# Patient Record
Sex: Female | Born: 1970 | Race: White | Hispanic: No | Smoking: Never smoker
Health system: Southern US, Community
[De-identification: ages and names within clinical notes are randomized; demographics above are authoritative.]

## PROBLEM LIST (undated history)

## (undated) DIAGNOSIS — K5792 Diverticulitis of intestine, part unspecified, without perforation or abscess without bleeding: Secondary | ICD-10-CM

## (undated) DIAGNOSIS — G43909 Migraine, unspecified, not intractable, without status migrainosus: Secondary | ICD-10-CM

## (undated) DIAGNOSIS — R7303 Prediabetes: Secondary | ICD-10-CM

## (undated) DIAGNOSIS — K219 Gastro-esophageal reflux disease without esophagitis: Secondary | ICD-10-CM

## (undated) DIAGNOSIS — T783XXA Angioneurotic edema, initial encounter: Secondary | ICD-10-CM

## (undated) DIAGNOSIS — K589 Irritable bowel syndrome without diarrhea: Secondary | ICD-10-CM

## (undated) DIAGNOSIS — E079 Disorder of thyroid, unspecified: Secondary | ICD-10-CM

## (undated) DIAGNOSIS — L509 Urticaria, unspecified: Secondary | ICD-10-CM

## (undated) DIAGNOSIS — R112 Nausea with vomiting, unspecified: Secondary | ICD-10-CM

## (undated) DIAGNOSIS — D332 Benign neoplasm of brain, unspecified: Secondary | ICD-10-CM

## (undated) DIAGNOSIS — L309 Dermatitis, unspecified: Secondary | ICD-10-CM

## (undated) DIAGNOSIS — F329 Major depressive disorder, single episode, unspecified: Secondary | ICD-10-CM

## (undated) DIAGNOSIS — Z9889 Other specified postprocedural states: Secondary | ICD-10-CM

## (undated) DIAGNOSIS — M25511 Pain in right shoulder: Secondary | ICD-10-CM

## (undated) DIAGNOSIS — F419 Anxiety disorder, unspecified: Secondary | ICD-10-CM

## (undated) DIAGNOSIS — K5790 Diverticulosis of intestine, part unspecified, without perforation or abscess without bleeding: Secondary | ICD-10-CM

## (undated) DIAGNOSIS — R42 Dizziness and giddiness: Secondary | ICD-10-CM

## (undated) DIAGNOSIS — I10 Essential (primary) hypertension: Secondary | ICD-10-CM

## (undated) DIAGNOSIS — G8929 Other chronic pain: Secondary | ICD-10-CM

## (undated) DIAGNOSIS — F32A Depression, unspecified: Secondary | ICD-10-CM

## (undated) HISTORY — PX: ENDOMETRIAL ABLATION: SHX621

## (undated) HISTORY — PX: NASAL SINUS SURGERY: SHX719

## (undated) HISTORY — PX: COLONOSCOPY WITH ESOPHAGOGASTRODUODENOSCOPY (EGD): SHX5779

## (undated) HISTORY — DX: Urticaria, unspecified: L50.9

## (undated) HISTORY — PX: BRAIN SURGERY: SHX531

## (undated) HISTORY — PX: COSMETIC SURGERY: SHX468

## (undated) HISTORY — PX: ABLATION: SHX5711

## (undated) HISTORY — DX: Angioneurotic edema, initial encounter: T78.3XXA

## (undated) HISTORY — PX: TYMPANOSTOMY TUBE PLACEMENT: SHX32

## (undated) HISTORY — DX: Dermatitis, unspecified: L30.9

## (undated) HISTORY — PX: ABDOMINAL HYSTERECTOMY: SHX81

## (undated) HISTORY — PX: OVARIAN CYST REMOVAL: SHX89

## (undated) HISTORY — PX: BELPHAROPTOSIS REPAIR: SHX369

---
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
## 1988-02-23 DIAGNOSIS — R569 Unspecified convulsions: Secondary | ICD-10-CM

## 1988-02-23 HISTORY — DX: Unspecified convulsions: R56.9

## 2014-12-23 ENCOUNTER — Emergency Department (HOSPITAL_BASED_OUTPATIENT_CLINIC_OR_DEPARTMENT_OTHER): Payer: Self-pay

## 2014-12-23 ENCOUNTER — Encounter (HOSPITAL_BASED_OUTPATIENT_CLINIC_OR_DEPARTMENT_OTHER): Payer: Self-pay | Admitting: Emergency Medicine

## 2014-12-23 ENCOUNTER — Emergency Department (HOSPITAL_BASED_OUTPATIENT_CLINIC_OR_DEPARTMENT_OTHER)
Admission: EM | Admit: 2014-12-23 | Discharge: 2014-12-23 | Disposition: A | Discharge status: Home / Self Care | Payer: Self-pay | Attending: Emergency Medicine | Admitting: Emergency Medicine

## 2014-12-23 DIAGNOSIS — R51 Headache: Secondary | ICD-10-CM | POA: Insufficient documentation

## 2014-12-23 DIAGNOSIS — Z8679 Personal history of other diseases of the circulatory system: Secondary | ICD-10-CM | POA: Insufficient documentation

## 2014-12-23 DIAGNOSIS — Z85841 Personal history of malignant neoplasm of brain: Secondary | ICD-10-CM | POA: Insufficient documentation

## 2014-12-23 DIAGNOSIS — R42 Dizziness and giddiness: Secondary | ICD-10-CM | POA: Insufficient documentation

## 2014-12-23 HISTORY — DX: Dizziness and giddiness: R42

## 2014-12-23 HISTORY — DX: Benign neoplasm of brain, unspecified: D33.2

## 2014-12-23 HISTORY — DX: Migraine, unspecified, not intractable, without status migrainosus: G43.909

## 2014-12-23 IMAGING — CT CT HEAD W/O CM
1 series · 16 of 30 positions shown, 20 images · non-contrast
Comparison: None.

CLINICAL DATA: Left-sided headache and dizziness. History of
migraine headaches and surgery for benign brain tumor.

EXAM:
CT HEAD WITHOUT CONTRAST
TECHNIQUE: Contiguous axial images were obtained from the base of the skull
through the vertex without intravenous contrast.

[Series 2: head 4.8 h37s · axial · 0.44mm/px · z∈[-142,-6]mm · 16 of 32 slices shown, 20 images]
[im 2/32  brain]
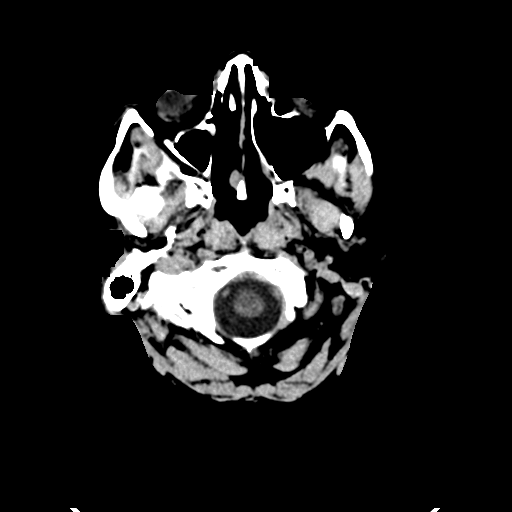
[im 2/32  bone]
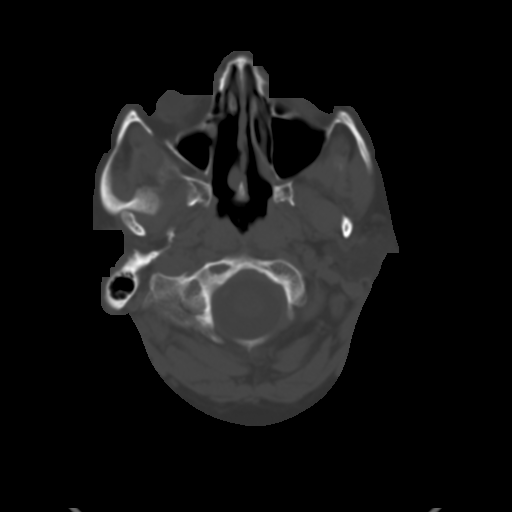
[im 4/32  brain]
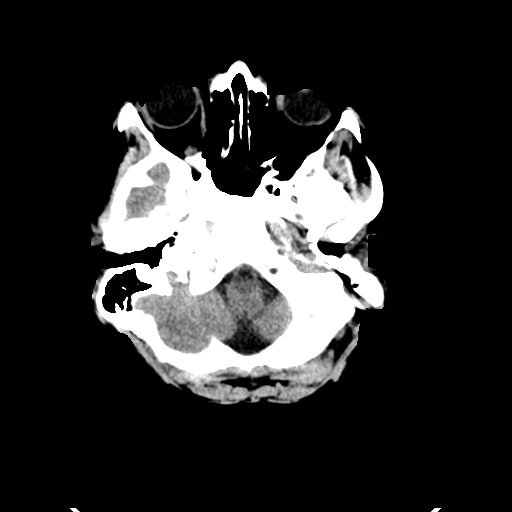
[im 6/32  brain]
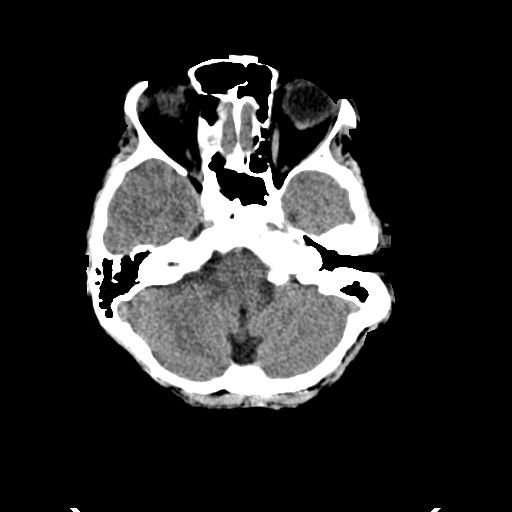
[im 8/32  brain]
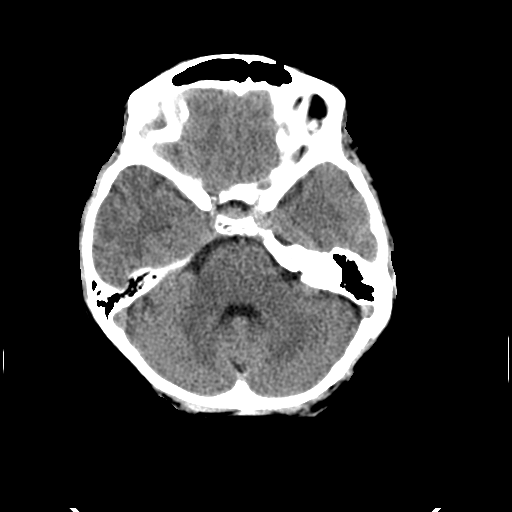
[im 9/32  brain]
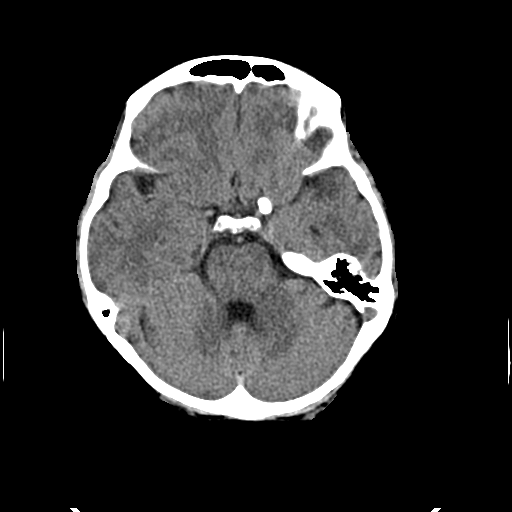
[im 9/32  bone]
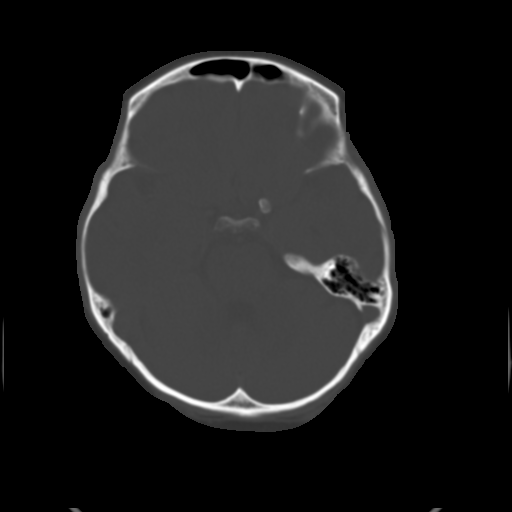
[im 11/32  brain]
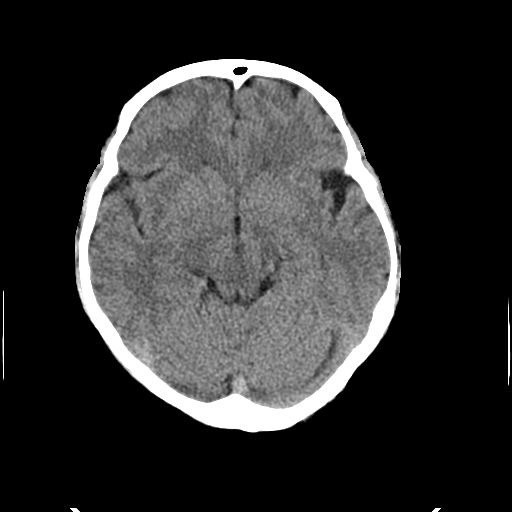
[im 13/32  brain]
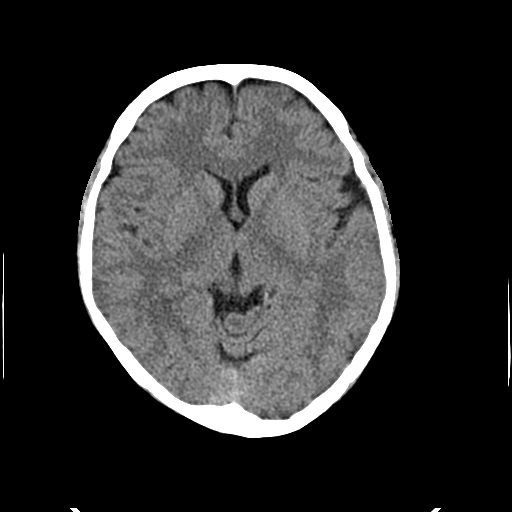
[im 15/32  brain]
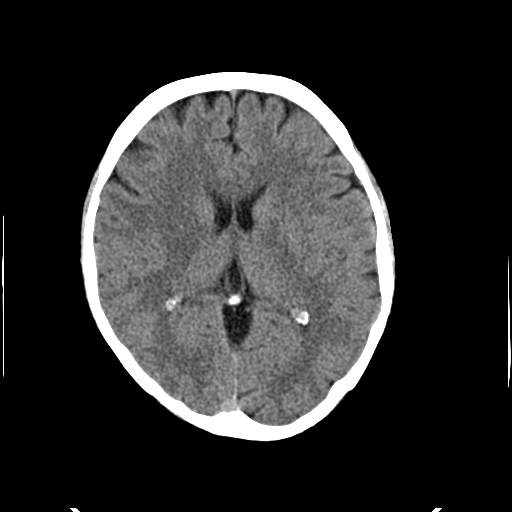
[im 17/32  brain]
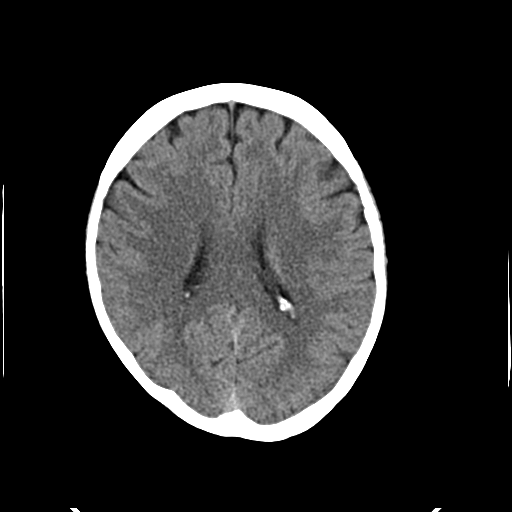
[im 17/32  bone]
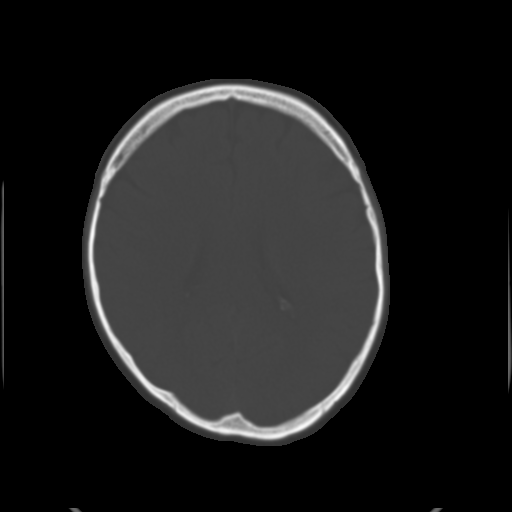
[im 19/32  brain]
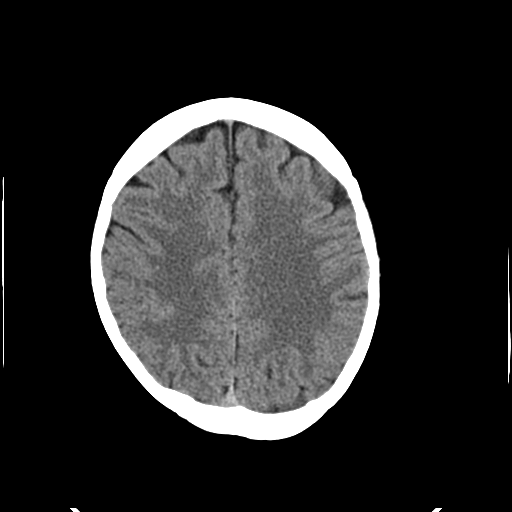
[im 21/32  brain]
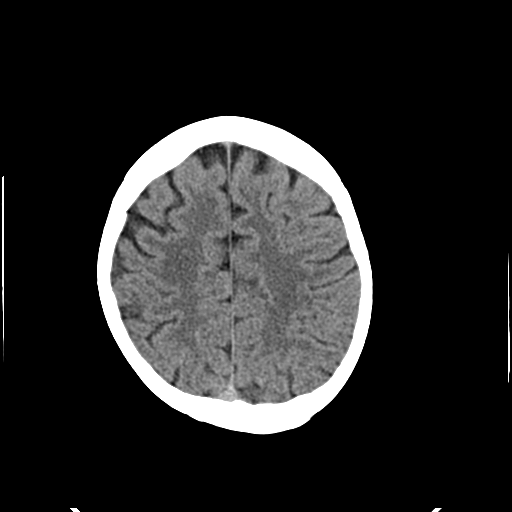
[im 23/32  brain]
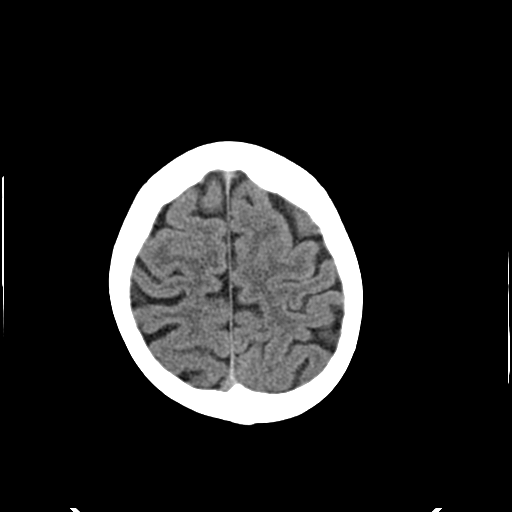
[im 24/32  brain]
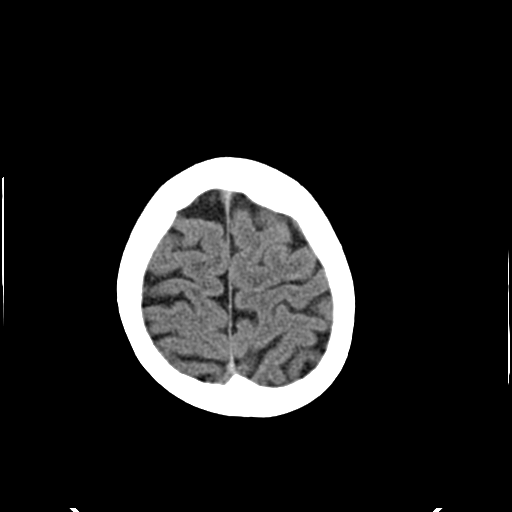
[im 24/32  bone]
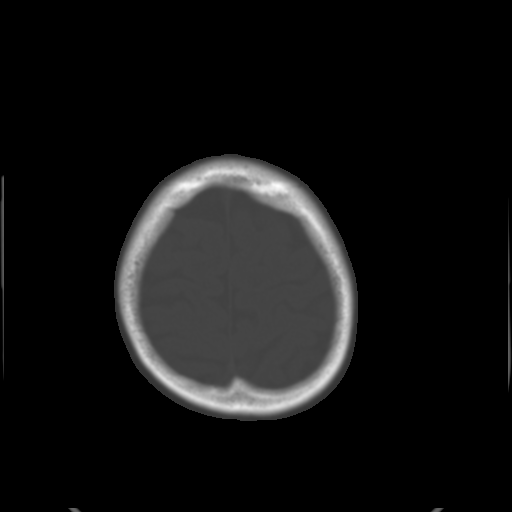
[im 26/32  brain]
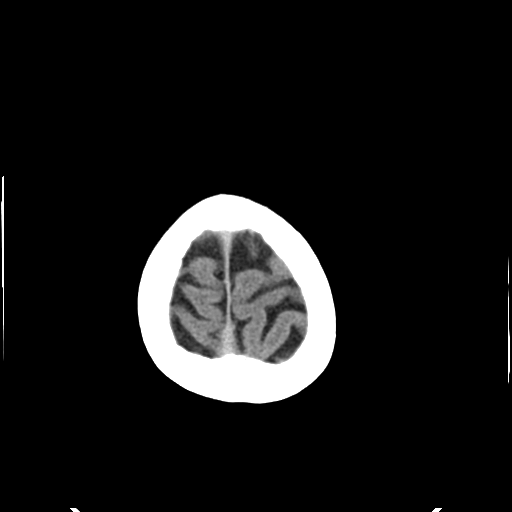
[im 28/32  brain]
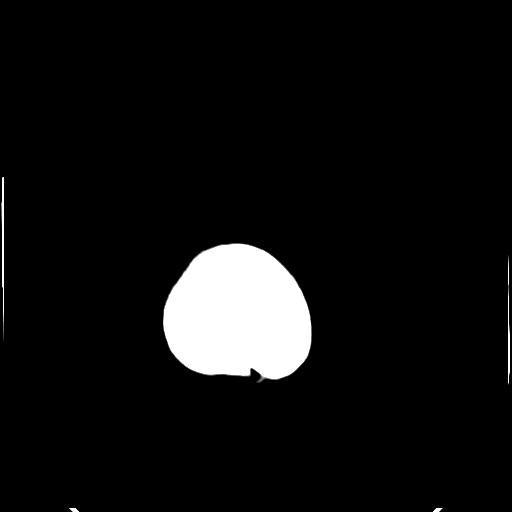
[im 30/32  brain]
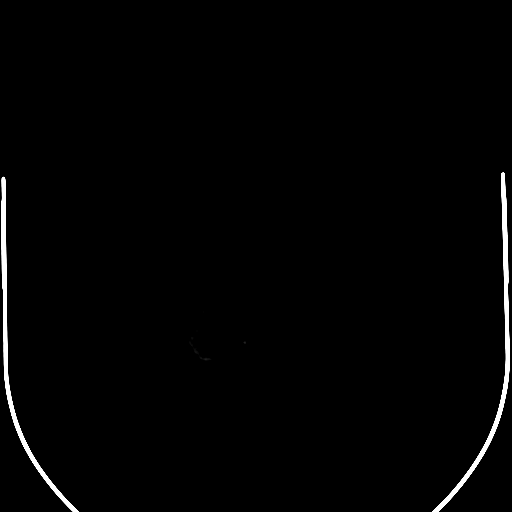

[16 of 30 positions shown; findings below may reference images not displayed]

FINDINGS: The brain demonstrates no evidence of hemorrhage, infarction, edema,
mass effect, extra-axial fluid collection, hydrocephalus or mass
lesion. The skull is unremarkable and shows no evidence of prior
craniotomy.
IMPRESSION: Normal head CT.

## 2014-12-23 MED ORDER — MECLIZINE HCL 25 MG PO TABS
25.0000 mg | ORAL_TABLET | Freq: Once | ORAL | Status: AC
  Administered 2014-12-23: 25 mg via ORAL
  Filled 2014-12-23: qty 1

## 2014-12-23 MED ORDER — MAGNESIUM SULFATE 2 GM/50ML IV SOLN
2.0000 g | Freq: Once | INTRAVENOUS | Status: AC
  Administered 2014-12-23: 2 g via INTRAVENOUS
  Filled 2014-12-23: qty 50

## 2014-12-23 MED ORDER — DIPHENHYDRAMINE HCL 50 MG/ML IJ SOLN
50.0000 mg | Freq: Once | INTRAMUSCULAR | Status: AC
  Administered 2014-12-23: 50 mg via INTRAVENOUS
  Filled 2014-12-23: qty 1

## 2014-12-23 MED ORDER — METOCLOPRAMIDE HCL 5 MG/ML IJ SOLN
10.0000 mg | Freq: Once | INTRAMUSCULAR | Status: AC
  Administered 2014-12-23: 10 mg via INTRAVENOUS
  Filled 2014-12-23: qty 2

## 2014-12-23 MED ORDER — KETOROLAC TROMETHAMINE 30 MG/ML IJ SOLN
30.0000 mg | Freq: Once | INTRAMUSCULAR | Status: AC
  Administered 2014-12-23: 30 mg via INTRAVENOUS
  Filled 2014-12-23: qty 1

## 2014-12-23 MED ORDER — LORAZEPAM 1 MG PO TABS
1.0000 mg | ORAL_TABLET | Freq: Once | ORAL | Status: AC
  Administered 2014-12-23: 1 mg via ORAL
  Filled 2014-12-23: qty 1

## 2014-12-23 NOTE — ED Notes (Signed)
Pt states she feels better, not dizzy anymore

## 2014-12-23 NOTE — ED Notes (Signed)
Migraine since midday to left side of head - per her usual migraine.  Took Ibuprofen.  Dizziness and nausea since 3pm.

## 2014-12-23 NOTE — ED Provider Notes (Signed)
CSN: [id number]     Arrival date & time 12/23/14  1749 History   First MD Initiated Contact with Patient 12/23/14 1759     Chief Complaint  Patient presents with  . Dizziness     (Consider location/radiation/quality/duration/timing/severity/associated sxs/prior Treatment) HPI Natassja Ollis is a 44 y.o. female who comes in for evaluation of headache and dizziness. Patient reports she has a history of migraines, felt like she was having a typical migraine today at 11:00 AM. She felt it was mild and only took ibuprofen at this time. Reports she typically takes meclizine in addition, but decided not to take it due to the mild symptoms. She reports her migraine was improved, but was still present "just enough to annoy me" when she was driving to work at 4:31 PM. She reports while sitting at a traffic light she began to "see white spots, like when I'm about to have a migraine". She reports falling over on the side of the road at that point. She reports sitting for 20 minutes and her symptoms began to improve. She tried getting out of the car and stepped up on the sidewalk at which point "I felt like I was veering to the left and could not walk straight". She reports associated nausea but no vomiting. She also reports feeling hot, bilateral hand tightness as well as her lips feeling dry. Denies any other numbness or weaknesses. No fevers or chills.  Past Medical History  Diagnosis Date  . Migraines   . Vertigo   . Brain tumor (benign) [facility])    Past Surgical History  Procedure Laterality Date  . Brain surgery    . Cosmetic surgery      Facial surgery due to dog bite  . Nasal sinus surgery    . Endometrial ablation     No family history on file. Social History  Substance Use Topics  . Smoking status: Never Smoker   . Smokeless tobacco: None  . Alcohol Use: No   OB History    No data available     Review of Systems A 10 point review of systems was completed and was negative except for  pertinent positives and negatives as mentioned in the history of present illness     Allergies  Review of patient's allergies indicates no known allergies.  Home Medications   Prior to Admission medications   Not on File   BP 101/58 mmHg  Pulse 63  Temp(Src) 97.7 F (36.5 C) (Oral)  Resp 14  Ht 5\' 5"  (1.651 m)  Wt 160 lb (72.576 kg)  BMI 26.63 kg/m2  SpO2 97% Physical Exam  Constitutional: She is oriented to person, place, and time. She appears well-developed and well-nourished.  HENT:  Head: Normocephalic and atraumatic.  Mouth/Throat: Oropharynx is clear and moist.  Eyes: Conjunctivae and EOM are normal. Pupils are equal, round, and reactive to light. Right eye exhibits no discharge. Left eye exhibits no discharge. No scleral icterus.  No nystagmus  Neck: Normal range of motion. Neck supple.  No meningismus or nuchal rigidity  Cardiovascular: Normal rate, regular rhythm and normal heart sounds.   Pulmonary/Chest: Effort normal and breath sounds normal. No respiratory distress. She has no wheezes. She has no rales.  Abdominal: Soft. There is no tenderness.  Musculoskeletal: Normal range of motion. She exhibits no tenderness.  Neurological: She is alert and oriented to person, place, and time.  Cranial nerves III through XII grossly intact. Motor strength is 5/5 in all 4 extremities. Sensation  intact to light touch. No pronator drift. Completes finger to nose and heel to shin coordination movements without difficulty. Gait appears somewhat unsteady as she walks to the bathroom, but no obvious ataxia.  Skin: Skin is warm and dry. No rash noted.  Psychiatric: She has a normal mood and affect.  Nursing note and vitals reviewed.   ED Course  Procedures (including critical care time) Labs Review Labs Reviewed - No data to display  Imaging Review Ct Head Wo Contrast  12/23/2014  CLINICAL DATA:  Left-sided headache and dizziness. History of migraine headaches and surgery for  benign brain tumor. EXAM: CT HEAD WITHOUT CONTRAST TECHNIQUE: Contiguous axial images were obtained from the base of the skull through the vertex without intravenous contrast. COMPARISON:  None. FINDINGS: The brain demonstrates no evidence of hemorrhage, infarction, edema, mass effect, extra-axial fluid collection, hydrocephalus or mass lesion. The skull is unremarkable and shows no evidence of prior craniotomy. IMPRESSION: Normal head CT. Electronically Signed   By: Aletta Edouard M.D.   On: 12/23/2014 18:52   I have personally reviewed and evaluated these images and lab results as part of my medical decision-making.   EKG Interpretation None     Meds given in ED:  Medications  meclizine (ANTIVERT) tablet 25 mg (25 mg Oral Given 12/23/14 1827)  LORazepam (ATIVAN) tablet 1 mg (1 mg Oral Given 12/23/14 1937)  ketorolac (TORADOL) 30 MG/ML injection 30 mg (30 mg Intravenous Given 12/23/14 2030)  metoCLOPramide (REGLAN) injection 10 mg (10 mg Intravenous Given 12/23/14 2033)  diphenhydrAMINE (BENADRYL) injection 50 mg (50 mg Intravenous Given 12/23/14 2031)  magnesium sulfate IVPB 2 g 50 mL (0 g Intravenous Stopped 12/23/14 2153)    There are no discharge medications for this patient.  Filed Vitals:   12/23/14 1756 12/23/14 1927 12/23/14 2153  BP: 127/74 104/63 101/58  Pulse: 69 63 63  Temp: 97.7 F (36.5 C)    TempSrc: Oral    Resp: 16 16 14   Height: 5\' 5"  (1.651 m)    Weight: 160 lb (72.576 kg)    SpO2: 98% 100% 97%    MDM  Rahaf Carbonell is a 44 y.o. female with history of documented migraines and vertigo comes in for evaluation of dizziness. Patient only has dizziness when she moves her head from left-to-right. Denies any symptoms at rest. Patient with no focal neurodeficits on exam. Gait is slightly unsteady but no ataxia. Due to previous history and unclear etiology of dizziness, obtain CT head. CT head is negative. Patient is resting comfortably in the ED after migraine  cocktail. Reports the dizziness has resolved after treatment. Discussed exam, results of CT imaging with family at bedside. They report the patient has "dizzy spells" frequently regardless of whether or not she has headache. Patient did not take her meclizine today which may explain her dizzy spells. At this time, family at bedside is comfortable with and requesting patient being discharged home and they do not want to pursue any further imaging or investigative processes. I feel this is reasonable given patient has had a nonfocal neuro exam, normal CT of her head and family reports the symptoms have happened in the past. Her symptoms seem to be vertiginous in nature and I have a low suspicion for central vertigo or other central lesion pathology. No evidence of other acute or emergent pathology. Overall, patient appears well and is appropriate for discharge. Prior to discharge, I discussed and reviewed this case with my attending, Dr. Ralene Bathe. Final diagnoses:  Dizziness       Comer Locket, PA-C 12/24/14 [place], MD 01/02/15 [phone]

## 2014-12-23 NOTE — ED Notes (Signed)
Pt states she is still dizzy after medication, provider informed.

## 2014-12-23 NOTE — Discharge Instructions (Signed)
There does not appear to be an emergent cause for your symptoms at this time. Please follow-up with your doctor for reevaluation in one to 2 days. Your exam was reassuring in your CT scan of her head did not show any acute problems. Return to ED for worsening symptoms.  Dizziness Dizziness is a common problem. It is a feeling of unsteadiness or light-headedness. You may feel like you are about to faint. Dizziness can lead to injury if you stumble or fall. Anyone can become dizzy, but dizziness is more common in older adults. This condition can be caused by a number of things, including medicines, dehydration, or illness. HOME CARE INSTRUCTIONS Taking these steps may help with your condition: Eating and Drinking  Drink enough fluid to keep your urine clear or pale yellow. This helps to keep you from becoming dehydrated. Try to drink more clear fluids, such as water.  Do not drink alcohol.  Limit your caffeine intake if directed by your health care provider.  Limit your salt intake if directed by your health care provider. Activity  Avoid making quick movements.  Rise slowly from chairs and steady yourself until you feel okay.  In the morning, first sit up on the side of the bed. When you feel okay, stand slowly while you hold onto something until you know that your balance is fine.  Move your legs often if you need to stand in one place for a long time. Tighten and relax your muscles in your legs while you are standing.  Do not drive or operate heavy machinery if you feel dizzy.  Avoid bending down if you feel dizzy. Place items in your home so that they are easy for you to reach without leaning over. Lifestyle  Do not use any tobacco products, including cigarettes, chewing tobacco, or electronic cigarettes. If you need help quitting, ask your health care provider.  Try to reduce your stress level, such as with yoga or meditation. Talk with your health care provider if you need  help. General Instructions  Watch your dizziness for any changes.  Take medicines only as directed by your health care provider. Talk with your health care provider if you think that your dizziness is caused by a medicine that you are taking.  Tell a friend or a family member that you are feeling dizzy. If he or she notices any changes in your behavior, have this person call your health care provider.  Keep all follow-up visits as directed by your health care provider. This is important. SEEK MEDICAL CARE IF:  Your dizziness does not go away.  Your dizziness or light-headedness gets worse.  You feel nauseous.  You have reduced hearing.  You have new symptoms.  You are unsteady on your feet or you feel like the room is spinning. SEEK IMMEDIATE MEDICAL CARE IF:  You vomit or have diarrhea and are unable to eat or drink anything.  You have problems talking, walking, swallowing, or using your arms, hands, or legs.  You feel generally weak.  You are not thinking clearly or you have trouble forming sentences. It may take a friend or family member to notice this.  You have chest pain, abdominal pain, shortness of breath, or sweating.  Your vision changes.  You notice any bleeding.  You have a headache.  You have neck pain or a stiff neck.  You have a fever.   This information is not intended to replace advice given to you by your health care  provider. Make sure you discuss any questions you have with your health care provider.   Document Released: 08/04/2000 Document Revised: 06/25/2014 Document Reviewed: 02/04/2014 Elsevier Interactive Patient Education 2016 [employer]  Benign Positional Vertigo Vertigo is the feeling that you or your surroundings are moving when they are not. Benign positional vertigo is the most common form of vertigo. The cause of this condition is not serious (is benign). This condition is triggered by certain movements and positions (is  positional). This condition can be dangerous if it occurs while you are doing something that could endanger you or others, such as driving.  CAUSES In many cases, the cause of this condition is not known. It may be caused by a disturbance in an area of the inner ear that helps your brain to sense movement and balance. This disturbance can be caused by a viral infection (labyrinthitis), head injury, or repetitive motion. RISK FACTORS This condition is more likely to develop in:  Women.  People who are 30 years of age or older. SYMPTOMS Symptoms of this condition usually happen when you move your head or your eyes in different directions. Symptoms may start suddenly, and they usually last for less than a minute. Symptoms may include:  Loss of balance and falling.  Feeling like you are spinning or moving.  Feeling like your surroundings are spinning or moving.  Nausea and vomiting.  Blurred vision.  Dizziness.  Involuntary eye movement (nystagmus). Symptoms can be mild and cause only slight annoyance, or they can be severe and interfere with daily life. Episodes of benign positional vertigo may return (recur) over time, and they may be triggered by certain movements. Symptoms may improve over time. DIAGNOSIS This condition is usually diagnosed by medical history and a physical exam of the head, neck, and ears. You may be referred to a health care provider who specializes in ear, nose, and throat (ENT) problems (otolaryngologist) or a provider who specializes in disorders of the nervous system (neurologist). You may have additional testing, including:  MRI.  A CT scan.  Eye movement tests. Your health care provider may ask you to change positions quickly while he or she watches you for symptoms of benign positional vertigo, such as nystagmus. Eye movement may be tested with an electronystagmogram (ENG), caloric stimulation, the Dix-Hallpike test, or the roll test.  An  electroencephalogram (EEG). This records electrical activity in your brain.  Hearing tests. TREATMENT Usually, your health care provider will treat this by moving your head in specific positions to adjust your inner ear back to normal. Surgery may be needed in severe cases, but this is rare. In some cases, benign positional vertigo may resolve on its own in 2-4 weeks. HOME CARE INSTRUCTIONS Safety  Move slowly.Avoid sudden body or head movements.  Avoid driving.  Avoid operating heavy machinery.  Avoid doing any tasks that would be dangerous to you or others if a vertigo episode would occur.  If you have trouble walking or keeping your balance, try using a cane for stability. If you feel dizzy or unstable, sit down right away.  Return to your normal activities as told by your health care provider. Ask your health care provider what activities are safe for you. General Instructions  Take over-the-counter and prescription medicines only as told by your health care provider.  Avoid certain positions or movements as told by your health care provider.  Drink enough fluid to keep your urine clear or pale yellow.  Keep all follow-up visits  as told by your health care provider. This is important. SEEK MEDICAL CARE IF:  You have a fever.  Your condition gets worse or you develop new symptoms.  Your family or friends notice any behavioral changes.  Your nausea or vomiting gets worse.  You have numbness or a "pins and needles" sensation. SEEK IMMEDIATE MEDICAL CARE IF:  You have difficulty speaking or moving.  You are always dizzy.  You faint.  You develop severe headaches.  You have weakness in your legs or arms.  You have changes in your hearing or vision.  You develop a stiff neck.  You develop sensitivity to light.   This information is not intended to replace advice given to you by your health care provider. Make sure you discuss any questions you have with your  health care provider.   Document Released: 11/16/2005 Document Revised: 10/30/2014 Document Reviewed: 06/03/2014 Elsevier Interactive Patient Education [employer].

## 2016-03-28 ENCOUNTER — Emergency Department (HOSPITAL_BASED_OUTPATIENT_CLINIC_OR_DEPARTMENT_OTHER)
Admission: EM | Admit: 2016-03-28 | Discharge: 2016-03-28 | Disposition: A | Discharge status: Home / Self Care | Payer: Self-pay | Attending: Emergency Medicine | Admitting: Emergency Medicine

## 2016-03-28 ENCOUNTER — Encounter (HOSPITAL_BASED_OUTPATIENT_CLINIC_OR_DEPARTMENT_OTHER): Payer: Self-pay | Admitting: Emergency Medicine

## 2016-03-28 DIAGNOSIS — R1110 Vomiting, unspecified: Secondary | ICD-10-CM | POA: Insufficient documentation

## 2016-03-28 DIAGNOSIS — R42 Dizziness and giddiness: Secondary | ICD-10-CM | POA: Insufficient documentation

## 2016-03-28 HISTORY — DX: Disorder of thyroid, unspecified: E07.9

## 2016-03-28 LAB — BASIC METABOLIC PANEL
ANION GAP: 6 (ref 5–15)
BUN: 13 mg/dL (ref 6–20)
CALCIUM: 9 mg/dL (ref 8.9–10.3)
CHLORIDE: 107 mmol/L (ref 101–111)
CO2: 25 mmol/L (ref 22–32)
CREATININE: 0.62 mg/dL (ref 0.44–1.00)
GFR calc Af Amer: >60 mL/min (ref >60)
GFR calc non Af Amer: >60 mL/min (ref >60)
Glucose, Bld: 109 mg/dL — ABNORMAL HIGH (ref 65–99)
Potassium: 3.9 mmol/L (ref 3.5–5.1)
SODIUM: 138 mmol/L (ref 135–145)

## 2016-03-28 LAB — CBC
HCT: 40.5 % (ref 36.0–46.0)
Hemoglobin: 13.2 g/dL (ref 12.0–15.0)
MCH: 32.4 pg (ref 26.0–34.0)
MCHC: 32.6 g/dL (ref 30.0–36.0)
MCV: 99.5 fL (ref 78.0–100.0)
PLATELETS: 278 10*3/uL (ref 150–400)
RBC: 4.07 MIL/uL (ref 3.87–5.11)
RDW: 12.3 % (ref 11.5–15.5)
WBC: 11.2 10*3/uL — AB (ref 4.0–10.5)

## 2016-03-28 LAB — URINALYSIS, ROUTINE W REFLEX MICROSCOPIC
Bilirubin Urine: NEGATIVE
Glucose, UA: NEGATIVE mg/dL
Hgb urine dipstick: NEGATIVE
Ketones, ur: NEGATIVE mg/dL
LEUKOCYTES UA: NEGATIVE
Nitrite: NEGATIVE
PROTEIN: NEGATIVE mg/dL
Specific Gravity, Urine: 1.019 (ref 1.005–1.030)
pH: 7.5 (ref 5.0–8.0)

## 2016-03-28 MED ORDER — SULFAMETHOXAZOLE-TRIMETHOPRIM 800-160 MG PO TABS
1.0000 | ORAL_TABLET | Freq: Once | ORAL | Status: AC
  Administered 2016-03-28: 1 via ORAL
  Filled 2016-03-28: qty 1

## 2016-03-28 MED ORDER — SULFAMETHOXAZOLE-TRIMETHOPRIM 800-160 MG PO TABS: 1.0000 | ORAL_TABLET | Freq: Two times a day (BID) | ORAL | 0 refills | Status: AC

## 2016-03-28 NOTE — ED Notes (Signed)
Reports near syncope episode after vomiting.  Reports hx of vertigo.  Patient reports feeling some what better at this time.  Reports post nasal drip.  Reports stress headaches.

## 2016-03-28 NOTE — Discharge Instructions (Signed)
Call your ENT physician tomorrow to arrange to be seen in the office within the next week. Return for fever, persistent vomiting. Or if you feel worse for any reason

## 2016-03-28 NOTE — ED Triage Notes (Signed)
Pt reports dizziness this morning with N/V that lasted around 5 hours. Pt reports hx of vertigo and chronic sinusitis. Pt reports dizziness has subsided. Currently reports facial pain and feeling hot. Pt denies chest pain and SOB

## 2016-03-28 NOTE — ED Provider Notes (Addendum)
[facility] DEPT [facility] Provider Note   CSN: [id number] Arrival date & time: 03/28/16  1541  By signing my name below, I, Judithe Modest, attest that this documentation has been prepared under the direction and in the presence of Orlie Dakin, MD. Electronically Signed: Judithe Modest, ER Scribe. 10/04/2015. 6:02 PM.  History   Chief Complaint Chief Complaint  Patient presents with  . Dizziness   The history is provided by the patient. No language interpreter was used.   HPI Comments: Kristine Zimmerman is a 46 y.o. female who presents to the Emergency Department complaining of being off balance since this morning with associated sinus pressure, two events of vomiting and intermittent left ear popping. States that feeling off balance is typical of the vertigo she's had in the past She has a PMHx of vertigo and migraine HA. She has taken meclozine today for her sx. With relief of vertigo. She had a sinus infection in January and was treated with zpac and avalox. She finished avelox two weeks ago and her sx improved after taking that medication. Her sx are worse with moving or walking and are better when she sits still. Her PCP is Dr. Amedeo Plenty at [facility]. She has a PMHx of hypothyroidism, petuitary tumor, and a surgical hx of sinus surgery performed by Dr. Shanon Brow More.. Vertigo is worse with moving her head or changing positions improved with remaining still and no nausea present. Complains of mild pressure at right side of face  Past Medical History:  Diagnosis Date  . Brain tumor (benign) ([place])   . Migraines   . Thyroid disease   . Vertigo    Pituitary Tumor age 20 There are no active problems to display for this patient.   Past Surgical History:  Procedure Laterality Date  . BRAIN SURGERY    . COSMETIC SURGERY     Facial surgery due to dog bite  . ENDOMETRIAL ABLATION    . NASAL SINUS SURGERY      OB History    No data available       Home Medications    Prior to Admission  medications   Medication Sig Start Date End Date Taking? Authorizing Provider  levothyroxine (SYNTHROID, LEVOTHROID) 100 MCG tablet Take 100 mcg by mouth daily before breakfast.   Yes Historical Provider, MD    Family History No family history on file.  Social History Social History  Substance Use Topics  . Smoking status: Never Smoker  . Smokeless tobacco: Never Used  . Alcohol use No     Allergies   Patient has no known allergies.   Review of Systems Review of Systems  Constitutional: Negative.  Negative for chills.  HENT: Positive for sinus pressure.   Respiratory: Negative.   Cardiovascular: Negative.   Gastrointestinal: Positive for vomiting.  Musculoskeletal: Negative.   Skin: Negative.   Neurological: Positive for dizziness.  Psychiatric/Behavioral: Negative.   All other systems reviewed and are negative.  A complete 10 system review of systems was obtained and all systems are negative except as noted in the HPI and PMH.   Physical Exam Updated Vital Signs BP (!) 109/44   Pulse 71   Temp 98 F (36.7 C) (Oral)   Resp 26   Ht 5\' 5"  (1.651 m)   Wt 160 lb (72.6 kg)   SpO2 100%   BMI 26.63 kg/m   Physical Exam  Constitutional: She is oriented to person, place, and time. She appears well-developed and well-nourished. No distress.  HENT:  Head: Normocephalic and atraumatic.  Right Ear: External ear normal.  Left Ear: External ear normal.  Bilateral tympanic membranes normal  Eyes: Pupils are equal, round, and reactive to light.  Neck: Neck supple.  Cardiovascular: Normal rate.   Pulmonary/Chest: Effort normal. No respiratory distress.  Musculoskeletal: Normal range of motion.  Neurological: She is alert and oriented to person, place, and time. Coordination normal.  Gait normal Romberg normal pronator drift normal finger to nose normal DTR symmetric bilaterally at knee jerk ankle jerk and biceps toes downgoing bilaterally  Skin: Skin is warm and dry. She  is not diaphoretic.  Psychiatric: She has a normal mood and affect. Her behavior is normal.  Nursing note and vitals reviewed.    ED Treatments / Results  DIAGNOSTIC STUDIES: Oxygen Saturation is 100% on RA, normal by my interpretation.    COORDINATION OF CARE: 6:03 PM Discussed treatment plan with pt at bedside and pt agreed to plan.  Labs (all labs ordered are listed, but only abnormal results are displayed) Labs Reviewed  BASIC METABOLIC PANEL - Abnormal; Notable for the following:       Result Value   Glucose, Bld 109 (*)    All other components within normal limits  CBC - Abnormal; Notable for the following:    WBC 11.2 (*)    All other components within normal limits  URINALYSIS, ROUTINE W REFLEX MICROSCOPIC - Abnormal; Notable for the following:    APPearance CLOUDY (*)    All other components within normal limits    EKG  EKG Interpretation  Date/Time:  Sunday March 28 2016 15:53:30 EST Ventricular Rate:  66 PR Interval:  192 QRS Duration: 88 QT Interval:  430 QTC Calculation: 450 R Axis:   11 Text Interpretation:  Normal sinus rhythm with sinus arrhythmia Normal ECG Confirmed by DELO  MD, DOUGLAS ([id number]) on 03/28/2016 4:00:36 PM      Results for orders placed or performed during the hospital encounter of [id number]  Basic metabolic panel  Result Value Ref Range   Sodium 138 135 - 145 mmol/L   Potassium 3.9 3.5 - 5.1 mmol/L   Chloride 107 101 - 111 mmol/L   CO2 25 22 - 32 mmol/L   Glucose, Bld 109 (H) 65 - 99 mg/dL   BUN 13 6 - 20 mg/dL   Creatinine, Ser 0.62 0.44 - 1.00 mg/dL   Calcium 9.0 8.9 - 10.3 mg/dL   GFR calc non Af Amer >60 >60 mL/min   GFR calc Af Amer >60 >60 mL/min   Anion gap 6 5 - 15  CBC  Result Value Ref Range   WBC 11.2 (H) 4.0 - 10.5 K/uL   RBC 4.07 3.87 - 5.11 MIL/uL   Hemoglobin 13.2 12.0 - 15.0 g/dL   HCT 40.5 36.0 - 46.0 %   MCV 99.5 78.0 - 100.0 fL   MCH 32.4 26.0 - 34.0 pg   MCHC 32.6 30.0 - 36.0 g/dL   RDW 12.3 11.5 -  15.5 %   Platelets 278 150 - 400 K/uL  Urinalysis, Routine w reflex microscopic  Result Value Ref Range   Color, Urine YELLOW YELLOW   APPearance CLOUDY (A) CLEAR   Specific Gravity, Urine 1.019 1.005 - 1.030   pH 7.5 5.0 - 8.0   Glucose, UA NEGATIVE NEGATIVE mg/dL   Hgb urine dipstick NEGATIVE NEGATIVE   Bilirubin Urine NEGATIVE NEGATIVE   Ketones, ur NEGATIVE NEGATIVE mg/dL   Protein, ur NEGATIVE NEGATIVE mg/dL  Nitrite NEGATIVE NEGATIVE   Leukocytes, UA NEGATIVE NEGATIVE   No results found.  Radiology No results found.  Procedures Procedures (including critical care time)  Medications Ordered in ED Medications - No data to display   Initial Impression / Assessment and Plan / ED Course  I have reviewed the triage vital signs and the nursing notes.  Pertinent labs & imaging results that were available during my care of the patient were reviewed by me and considered in my medical decision making (see chart for details).     Plan prescription Bactrim DS. Patient advised to call her ENT physician Dr. Laurance Flatten tomorrow to arrange for follow-up the fact that she's been treated with 2 previous courses of antibiotics Vertigo likely peripheral in etiology  Final Clinical Impressions(s) / ED Diagnoses  Dx#1 vertigo  #2 sinusitis  Final diagnoses:  None    New Prescriptions New Prescriptions   No medications on file      I personally performed the services described in this documentation, which was scribed in my presence. The recorded information has been reviewed and considered.        Orlie Dakin, MD 03/28/16 [id number]    Orlie Dakin, MD 03/28/16 [phone]

## 2016-03-29 DIAGNOSIS — K5790 Diverticulosis of intestine, part unspecified, without perforation or abscess without bleeding: Secondary | ICD-10-CM | POA: Insufficient documentation

## 2016-03-29 DIAGNOSIS — E039 Hypothyroidism, unspecified: Secondary | ICD-10-CM | POA: Insufficient documentation

## 2016-03-29 DIAGNOSIS — J309 Allergic rhinitis, unspecified: Secondary | ICD-10-CM | POA: Insufficient documentation

## 2016-03-29 DIAGNOSIS — G43109 Migraine with aura, not intractable, without status migrainosus: Secondary | ICD-10-CM

## 2016-03-29 DIAGNOSIS — R42 Dizziness and giddiness: Secondary | ICD-10-CM | POA: Insufficient documentation

## 2016-03-29 DIAGNOSIS — R519 Headache, unspecified: Secondary | ICD-10-CM | POA: Insufficient documentation

## 2016-03-29 DIAGNOSIS — Z87898 Personal history of other specified conditions: Secondary | ICD-10-CM | POA: Insufficient documentation

## 2016-03-29 DIAGNOSIS — J310 Chronic rhinitis: Secondary | ICD-10-CM | POA: Insufficient documentation

## 2016-03-29 DIAGNOSIS — G43809 Other migraine, not intractable, without status migrainosus: Secondary | ICD-10-CM | POA: Insufficient documentation

## 2017-04-13 ENCOUNTER — Emergency Department (HOSPITAL_BASED_OUTPATIENT_CLINIC_OR_DEPARTMENT_OTHER): Payer: Self-pay

## 2017-04-13 ENCOUNTER — Other Ambulatory Visit: Payer: Self-pay

## 2017-04-13 ENCOUNTER — Encounter (HOSPITAL_BASED_OUTPATIENT_CLINIC_OR_DEPARTMENT_OTHER): Payer: Self-pay | Admitting: Emergency Medicine

## 2017-04-13 ENCOUNTER — Emergency Department (HOSPITAL_BASED_OUTPATIENT_CLINIC_OR_DEPARTMENT_OTHER)
Admission: EM | Admit: 2017-04-13 | Discharge: 2017-04-13 | Disposition: A | Discharge status: Home / Self Care | Payer: Self-pay | Attending: Emergency Medicine | Admitting: Emergency Medicine

## 2017-04-13 DIAGNOSIS — Z79899 Other long term (current) drug therapy: Secondary | ICD-10-CM | POA: Insufficient documentation

## 2017-04-13 DIAGNOSIS — Y929 Unspecified place or not applicable: Secondary | ICD-10-CM | POA: Insufficient documentation

## 2017-04-13 DIAGNOSIS — E039 Hypothyroidism, unspecified: Secondary | ICD-10-CM | POA: Insufficient documentation

## 2017-04-13 DIAGNOSIS — Y939 Activity, unspecified: Secondary | ICD-10-CM | POA: Insufficient documentation

## 2017-04-13 DIAGNOSIS — S46912A Strain of unspecified muscle, fascia and tendon at shoulder and upper arm level, left arm, initial encounter: Secondary | ICD-10-CM | POA: Insufficient documentation

## 2017-04-13 DIAGNOSIS — X501XXA Overexertion from prolonged static or awkward postures, initial encounter: Secondary | ICD-10-CM | POA: Insufficient documentation

## 2017-04-13 DIAGNOSIS — Y990 Civilian activity done for income or pay: Secondary | ICD-10-CM | POA: Insufficient documentation

## 2017-04-13 HISTORY — DX: Pain in right shoulder: M25.511

## 2017-04-13 HISTORY — DX: Diverticulitis of intestine, part unspecified, without perforation or abscess without bleeding: K57.92

## 2017-04-13 HISTORY — DX: Other chronic pain: G89.29

## 2017-04-13 LAB — RAPID URINE DRUG SCREEN, HOSP PERFORMED
AMPHETAMINES: NOT DETECTED
Barbiturates: NOT DETECTED
Benzodiazepines: NOT DETECTED
Cocaine: NOT DETECTED
Opiates: NOT DETECTED
TETRAHYDROCANNABINOL: NOT DETECTED

## 2017-04-13 IMAGING — CR DG SHOULDER 2+V*R*
3 series · 3 of 3 positions shown · non-contrast
Comparison: None.

CLINICAL DATA: Pulling injury to shoulder with persistent pain,
initial encounter

EXAM:
RIGHT SHOULDER - 2+ VIEW

[w shoulder grashey right]
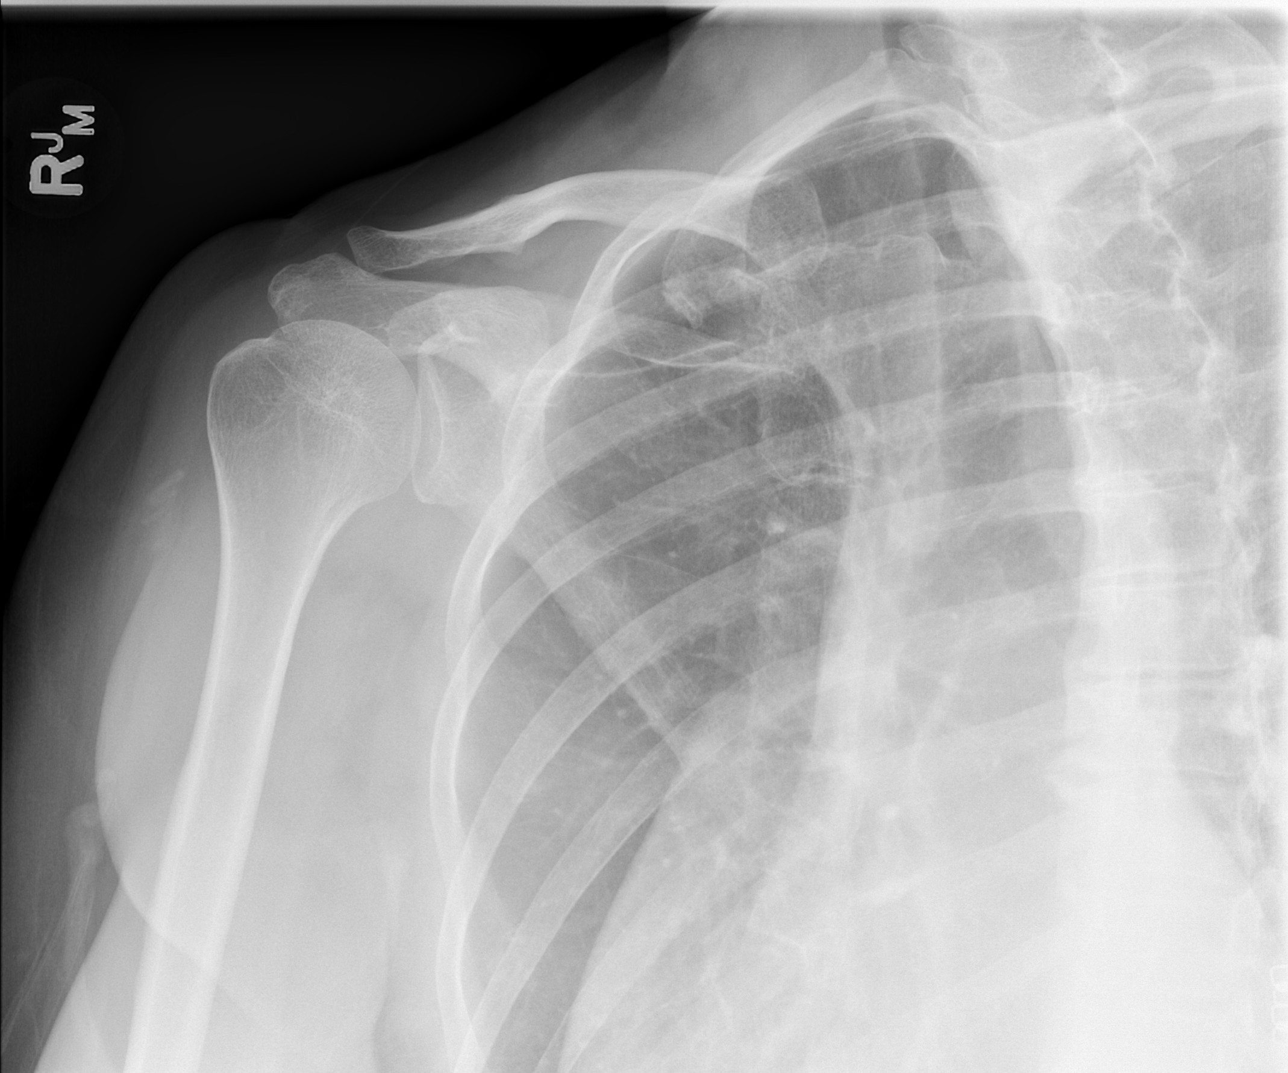

[w shoulder y view right]
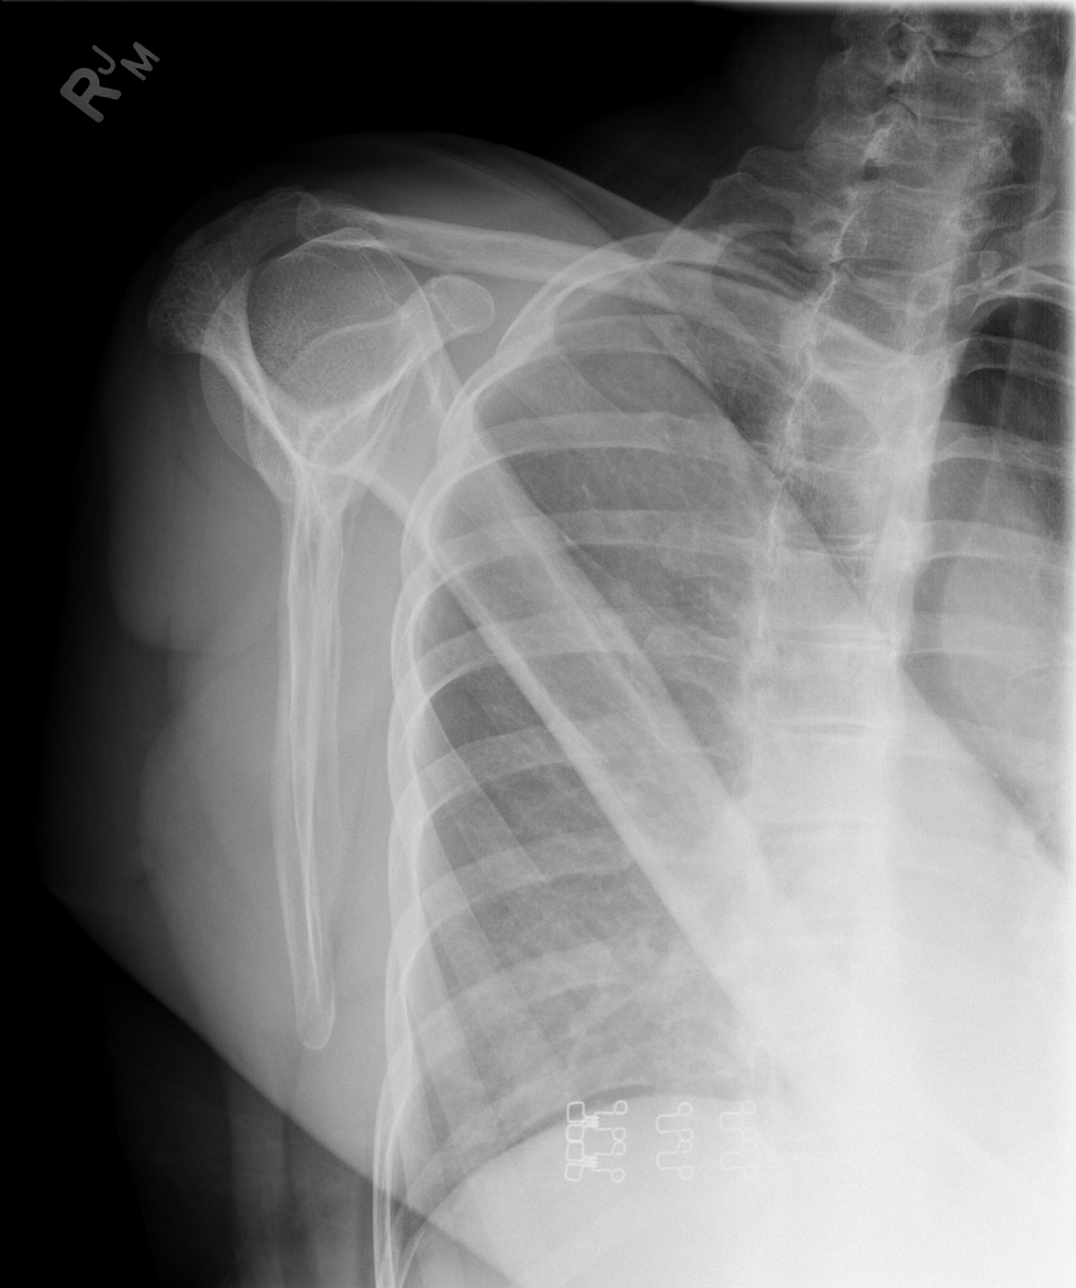

[w shoulder axillary right *]
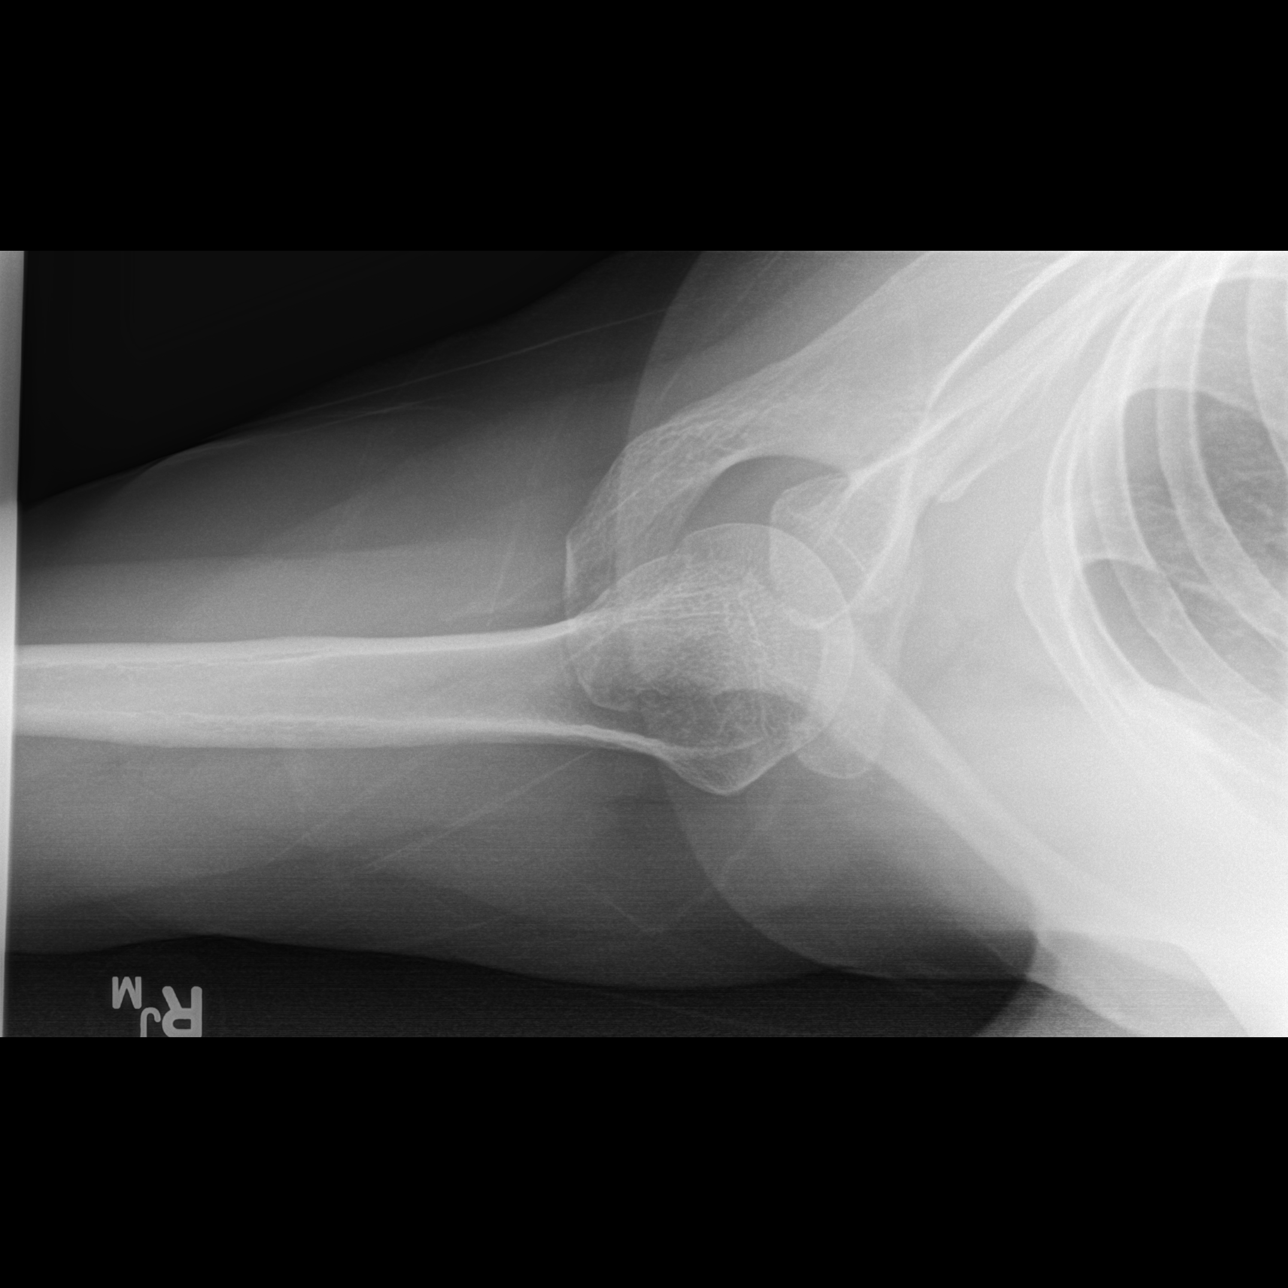

[3 of 3 positions shown; findings below may reference images not displayed]

FINDINGS: There is no evidence of fracture or dislocation. There is no
evidence of arthropathy or other focal bone abnormality. Soft
tissues are unremarkable.
IMPRESSION: No acute bony abnormality is noted.

## 2017-04-13 MED ORDER — TRAMADOL HCL 50 MG PO TABS: 50.0000 mg | ORAL_TABLET | Freq: Four times a day (QID) | ORAL | 0 refills | Status: DC | PRN

## 2017-04-13 MED ORDER — IBUPROFEN 800 MG PO TABS: 800.0000 mg | ORAL_TABLET | Freq: Three times a day (TID) | ORAL | 0 refills | Status: DC | PRN

## 2017-04-13 MED ORDER — KETOROLAC TROMETHAMINE 60 MG/2ML IM SOLN
60.0000 mg | Freq: Once | INTRAMUSCULAR | Status: AC
  Administered 2017-04-13: 60 mg via INTRAMUSCULAR
  Filled 2017-04-13: qty 2

## 2017-04-13 MED FILL — IBUPROFEN 800 MG TAB: 800 | 7 days supply | Qty: 21 | Fill #0

## 2017-04-13 MED FILL — traMADol HCL 50 MG TABS: 50 | 3 days supply | Qty: 15 | Fill #0

## 2017-04-13 NOTE — ED Provider Notes (Signed)
[facility] EMERGENCY DEPARTMENT Provider Note   CSN: [id number] Arrival date & time: 04/13/17  0109     History   Chief Complaint Chief Complaint  Patient presents with  . Shoulder Pain    HPI Kristine Zimmerman is a 47 y.o. female.  Presents with complaints of right shoulder pain.  She injured her shoulder at work yesterday.  Patient reports that she was pushing a heavy cart to try to move it and felt a pop in the posterior aspect of her right upper shoulder.  She has been having pain ever since.  Pain is constant and moderate but worsens with any movement.      Past Medical History:  Diagnosis Date  . Brain tumor (benign) ([facility] [place])   . Chronic right shoulder pain   . Diverticulitis   . Migraines   . Thyroid disease   . Vertigo     There are no active problems to display for this patient.   Past Surgical History:  Procedure Laterality Date  . BRAIN SURGERY    . COSMETIC SURGERY     Facial surgery due to dog bite  . ENDOMETRIAL ABLATION    . NASAL SINUS SURGERY      OB History    No data available       Home Medications    Prior to Admission medications   Medication Sig Start Date End Date Taking? Authorizing Provider  montelukast (SINGULAIR) 10 MG tablet Take 10 mg by mouth at bedtime.   Yes [provider]  ibuprofen (ADVIL,MOTRIN) 800 MG tablet Take 1 tablet (800 mg total) by mouth every 8 (eight) hours as needed for moderate pain. 04/13/17   Orpah Greek, MD  levothyroxine (SYNTHROID, LEVOTHROID) 100 MCG tablet Take 100 mcg by mouth daily before breakfast.    [provider]  traMADol (ULTRAM) 50 MG tablet Take 1 tablet (50 mg total) by mouth every 6 (six) hours as needed. 04/13/17   Orpah Greek, MD    Family History No family history on file.  Social History Social History   Tobacco Use  . Smoking status: Never Smoker  . Smokeless tobacco: Never Used  Substance Use Topics  . Alcohol use: No  . Drug  use: No     Allergies   Patient has no known allergies.   Review of Systems Review of Systems  Musculoskeletal: Positive for arthralgias.  All other systems reviewed and are negative.    Physical Exam Updated Vital Signs BP (!) 111/52 (BP Location: Right Arm)   Pulse 70   Temp 98.5 F (36.9 C) (Oral)   Resp 18   Ht 5\' 5"  (1.651 m)   Wt 72.6 kg (160 lb)   LMP 03/29/2017   SpO2 99%   BMI 26.63 kg/m   Physical Exam  Constitutional: She is oriented to person, place, and time. She appears well-developed and well-nourished. No distress.  HENT:  Head: Normocephalic and atraumatic.  Right Ear: Hearing normal.  Left Ear: Hearing normal.  Nose: Nose normal.  Mouth/Throat: Oropharynx is clear and moist and mucous membranes are normal.  Eyes: Conjunctivae and EOM are normal. Pupils are equal, round, and reactive to light.  Neck: Normal range of motion. Neck supple.  Cardiovascular: Regular rhythm, S1 normal and S2 normal. Exam reveals no gallop and no friction rub.  No murmur heard. Pulmonary/Chest: Effort normal and breath sounds normal. No respiratory distress. She exhibits no tenderness.  Abdominal: Soft. Normal appearance and bowel sounds are  normal. There is no hepatosplenomegaly. There is no tenderness. There is no rebound, no guarding, no tenderness at McBurney's point and negative Murphy's sign. No hernia.  Musculoskeletal:       Right shoulder: She exhibits decreased range of motion and tenderness. She exhibits no deformity.       Arms: Neurological: She is alert and oriented to person, place, and time. She has normal strength. No cranial nerve deficit or sensory deficit. Coordination normal. GCS eye subscore is 4. GCS verbal subscore is 5. GCS motor subscore is 6.  Skin: Skin is warm, dry and intact. No rash noted. No cyanosis.  Psychiatric: She has a normal mood and affect. Her speech is normal and behavior is normal. Thought content normal.  Nursing note and vitals  reviewed.    ED Treatments / Results  Labs (all labs ordered are listed, but only abnormal results are displayed) Labs Reviewed  RAPID URINE DRUG SCREEN, HOSP PERFORMED    EKG  EKG Interpretation None       Radiology No results found.  Procedures Procedures (including critical care time)  Medications Ordered in ED Medications  ketorolac (TORADOL) injection 60 mg (60 mg Intramuscular Given 04/13/17 0704)     Initial Impression / Assessment and Plan / ED Course  I have reviewed the triage vital signs and the nursing notes.  Pertinent labs & imaging results that were available during my care of the patient were reviewed by me and considered in my medical decision making (see chart for details).       Final Clinical Impressions(s) / ED Diagnoses   Final diagnoses:  Strain of left shoulder, initial encounter    ED Discharge Orders        Ordered    traMADol (ULTRAM) 50 MG tablet  Every 6 hours PRN     04/13/17 0720    ibuprofen (ADVIL,MOTRIN) 800 MG tablet  Every 8 hours PRN     04/13/17 0720       Orpah Greek, MD 04/13/17 [phone]

## 2017-04-13 NOTE — ED Provider Notes (Signed)
Assumed care from Dr. Betsey Holiday at 6:58 AM. Briefly, the patient is a 47 y.o. female with PMHx of  has a past medical history of Brain tumor (benign) ([facility]), Chronic right shoulder pain, Diverticulitis, Migraines, Thyroid disease, and Vertigo. here with shoulder pain after lifting boxes. Imaging pending. Likely [facility], d/c home pending film results. Ultram/NSAIDs provided by Dr. Betsey Holiday.   Labs Reviewed  RAPID URINE DRUG SCREEN, HOSP PERFORMED    Course of Care: -XR neg for fx and is c/w chronic arthropathy per my read. D/c with supportive care and outpt follow-up. Pt updated and in agreement.       Duffy Bruce, MD 04/13/17 [phone]

## 2017-04-13 NOTE — ED Triage Notes (Signed)
Pt presents with right shoulder pain from lifting boxes at work last PM.

## 2017-04-13 NOTE — Discharge Instructions (Addendum)
As we discussed, I think your symptoms today are due to a rotator cuff injury.  The rotator cuff is a series of muscles and tendons that connect to your shoulder to help it rotate.  It is very common in people who use small motions very repetitively to injure these muscles.  Once there injured, your body tries to compensate by changing the way your shoulder removed, which can cause further injury.  It is therefore very important that she follow-up with an orthopedist for further treatment and evaluation.  If you continue to use the shoulder without adjusting your movements or following up, it is likely that you will further damage to the tendons of your shoulder, which can be difficult to repair.  This can also lead to chronic pain issues.

## 2017-04-13 NOTE — ED Notes (Signed)
ED Provider at bedside. 

## 2017-04-14 ENCOUNTER — Encounter: Payer: Self-pay | Admitting: Family Medicine

## 2017-04-14 ENCOUNTER — Ambulatory Visit (INDEPENDENT_AMBULATORY_CARE_PROVIDER_SITE_OTHER): Payer: Self-pay | Admitting: Family Medicine

## 2017-04-14 ENCOUNTER — Ambulatory Visit: Payer: Self-pay

## 2017-04-14 VITALS — BP 131/78 | HR 72 | Ht 65.0 in | Wt 150.0 lb

## 2017-04-14 DIAGNOSIS — K589 Irritable bowel syndrome without diarrhea: Secondary | ICD-10-CM | POA: Insufficient documentation

## 2017-04-14 DIAGNOSIS — M25511 Pain in right shoulder: Secondary | ICD-10-CM

## 2017-04-14 DIAGNOSIS — E663 Overweight: Secondary | ICD-10-CM | POA: Insufficient documentation

## 2017-04-14 MED ORDER — TIZANIDINE HCL 4 MG PO TABS: 4.0000 mg | ORAL_TABLET | Freq: Four times a day (QID) | ORAL | 1 refills | Status: DC | PRN

## 2017-04-14 MED ORDER — PREDNISONE 10 MG PO TABS: ORAL_TABLET | ORAL | 0 refills | Status: DC

## 2017-04-14 NOTE — Patient Instructions (Signed)
You have strained your trapezius muscle with peripheral nerve irritation, subacromial bursitis, less likely herniated a disc in your neck. Prednisone 6 day dose pack to relieve irritation/inflammation of the nerve. Aleve 2 tabs twice a day with food for pain and inflammation - start day AFTER finishing prednisone. Tizanidine three times a day as needed for muscle spasms (do not drive with this if it makes you sleepy). Consider cervical collar if severely painful. Simple range of motion exercises (neck rolls, isometric strengthening) within limits of pain to prevent further stiffness. Consider physical therapy for stretching, exercises, traction, and modalities in the future. Heat 15 minutes at a time 3-4 times a day to help with spasms. Watch head position when on computers, texting, when sleeping in bed - should in line with back to prevent further spasms. Follow up with me in 1 week.

## 2017-04-14 NOTE — Assessment & Plan Note (Signed)
obvious trapezius strain and spasms.  Ultrasound of shoulder performed and reviewed - only with subacromial bursitis but no obvious tears.  Consistent with either trapezius strain and peripheral nerve irritation, less likely disc herniation of neck.  Discussed options - start prednisone dose pack, tizanidine as needed.  Discussed ergonomic considerations.  F/u in 1 week.

## 2017-04-14 NOTE — Progress Notes (Signed)
PCP: Charline Bills, MD  Subjective:   HPI: Patient is a 47 y.o. female here for neck/right shoulder injury.  Patient reports she's had problems with her right shoulder previously but 2 days ago she was pushing a heavy cart in a freezer at work and felt a sharp pop in posterior right shoulder radiating up to neck. Associated tingling, numbness in this area. Neck feels very stiff, 7/10 level of pain. Arm has improved but neck still a problem. Pain worse with neck motions but also with trying to reach overhead. No skin changes.  Past Medical History:  Diagnosis Date  . Brain tumor (benign) ([place])   . Chronic right shoulder pain   . Diverticulitis   . Migraines   . Thyroid disease   . Vertigo     Current Outpatient Medications on File Prior to Visit  Medication Sig Dispense Refill  . levothyroxine (SYNTHROID, LEVOTHROID) 100 MCG tablet Take 100 mcg by mouth daily before breakfast.    . montelukast (SINGULAIR) 10 MG tablet Take 10 mg by mouth at bedtime.    . traMADol (ULTRAM) 50 MG tablet Take 1 tablet (50 mg total) by mouth every 6 (six) hours as needed. 15 tablet 0   No current facility-administered medications on file prior to visit.     Past Surgical History:  Procedure Laterality Date  . BRAIN SURGERY    . COSMETIC SURGERY     Facial surgery due to dog bite  . ENDOMETRIAL ABLATION    . NASAL SINUS SURGERY      No Known Allergies  Social History   Socioeconomic History  . Marital status: Divorced    Spouse name: Not on file  . Number of children: Not on file  . Years of education: Not on file  . Highest education level: Not on file  Social Needs  . Financial resource strain: Not on file  . Food insecurity - worry: Not on file  . Food insecurity - inability: Not on file  . Transportation needs - medical: Not on file  . Transportation needs - non-medical: Not on file  Occupational History  . Not on file  Tobacco Use  . Smoking status: Never Smoker  .  Smokeless tobacco: Never Used  Substance and Sexual Activity  . Alcohol use: No  . Drug use: No  . Sexual activity: Yes    Birth control/protection: Surgical  Other Topics Concern  . Not on file  Social History Narrative  . Not on file    History reviewed. No pertinent family history.  BP 131/78   Pulse 72   Ht 5\' 5"  (1.651 m)   Wt 150 lb (68 kg)   LMP 03/29/2017   BMI 24.96 kg/m   Review of Systems: See HPI above.     Objective:  Physical Exam:  Gen: NAD, comfortable in exam room  Neck: No gross deformity, swelling, bruising.  Spasms right trapezius. TTP right trapezius and cervical paraspinal region.  No midline/bony TTP. FROM with pain on extension and bilateral lateral rotations. BUE strength 5/5.   Sensation intact to light touch.   2+ equal reflexes in triceps, biceps, brachioradialis tendons. Negative spurlings. NV intact distal BUEs.  Right shoulder: No swelling, ecchymoses.  No gross deformity. No TTP. FROM but pain beyond 100 degrees abduction and flexion. Negative Hawkins, Neers. Negative Yergasons. Strength 5/5 with empty can and resisted internal/external rotation without pain. Negative apprehension. NV intact distally.  Left shoulder: No swelling, ecchymoses.  No gross deformity. No  TTP. FROM. Strength 5/5 with empty can and resisted internal/external rotation. NV intact distally.   MSK u/s right shoulder:  Biceps tendon intact on long and trans views without tenosynovitis.  Subscapularis intact without tear or cortical irregularity.  Infraspinatus appears thin but no obvious tears - thickness similar left infraspinatus.  Right supraspinatus appears normal without tears but subacromial bursitis noted.  Assessment & Plan:  1. Neck/right shoulder pain - obvious trapezius strain and spasms.  Ultrasound of shoulder performed and reviewed - only with subacromial bursitis but no obvious tears.  Consistent with either trapezius strain and peripheral  nerve irritation, less likely disc herniation of neck.  Discussed options - start prednisone dose pack, tizanidine as needed.  Discussed ergonomic considerations.  F/u in 1 week.

## 2017-04-20 ENCOUNTER — Telehealth: Payer: Self-pay | Admitting: Family Medicine

## 2017-04-20 ENCOUNTER — Ambulatory Visit: Payer: Self-pay | Admitting: Family Medicine

## 2017-04-20 ENCOUNTER — Encounter: Payer: Self-pay | Admitting: *Deleted

## 2017-04-20 NOTE — Telephone Encounter (Signed)
Patient called back and states she would like to try half a day on Monday if the work note could write her out until Friday

## 2017-04-20 NOTE — Telephone Encounter (Signed)
Patient came in the office for follow up appointment, however appointment was canceled due to provider being sick. Patient's phone is broken and did not get message.   Patient states her shoulder pain has gotten worse. She states she has a difficult time picking up and holding things. She has not gone into work this week due to pain and wanted to know if a work note could be done to write her out until rescheduled follow up on Monday.

## 2017-04-20 NOTE — Telephone Encounter (Signed)
Ok to write her out for this week with half days starting Monday - restrictions no overhead motions, no lifting more than 5 pounds with that arm.  Thanks!

## 2017-04-20 NOTE — Telephone Encounter (Signed)
Letter written and placed up front.

## 2017-04-21 ENCOUNTER — Ambulatory Visit: Payer: Self-pay | Admitting: Family Medicine

## 2017-04-25 ENCOUNTER — Ambulatory Visit (INDEPENDENT_AMBULATORY_CARE_PROVIDER_SITE_OTHER): Payer: Self-pay | Admitting: Family Medicine

## 2017-04-25 ENCOUNTER — Encounter: Payer: Self-pay | Admitting: Family Medicine

## 2017-04-25 DIAGNOSIS — M25511 Pain in right shoulder: Secondary | ICD-10-CM

## 2017-04-25 MED ORDER — NAPROXEN 500 MG PO TABS: 500.0000 mg | ORAL_TABLET | Freq: Two times a day (BID) | ORAL | 2 refills | Status: DC | PRN

## 2017-04-25 NOTE — Patient Instructions (Addendum)
We will go ahead with an MRI of your cervical spine as you're not improving as much as expected - let us know asap when you have coverage so we can do this. Take naproxen 500mg  twice a day with food for pain and inflammation Physical therapy is the other option we discussed.

## 2017-04-25 NOTE — Assessment & Plan Note (Signed)
concerning she has not improved as expected with prednisone dose pack, tizanidine.  Also concerning she's having issues with weakness, reflexive sharp pain in right arm.  Will go ahead with MRI to further assess. Naproxen with tizanidine in meantime.  Consider physical therapy.

## 2017-04-25 NOTE — Progress Notes (Signed)
PCP: Charline Bills, MD  Subjective:   HPI: Patient is a 47 y.o. female here for neck/right shoulder injury.  2/21: Patient reports she's had problems with her right shoulder previously but 2 days ago she was pushing a heavy cart in a freezer at work and felt a sharp pop in posterior right shoulder radiating up to neck. Associated tingling, numbness in this area. Neck feels very stiff, 7/10 level of pain. Arm has improved but neck still a problem. Pain worse with neck motions but also with trying to reach overhead. No skin changes.  3/4: Patient returns with continued severe pain in right side of neck, posterior right shoulder. Pain level 7/10, sharp. Worse with turning head. Having weakness - almost dropped granddaughter extending her out with her arms. Finished prednisone and taking tizanidine. No bowel/bladder dysfunction.  Past Medical History:  Diagnosis Date  . Brain tumor (benign) ([place])   . Chronic right shoulder pain   . Diverticulitis   . Migraines   . Thyroid disease   . Vertigo     Current Outpatient Medications on File Prior to Visit  Medication Sig Dispense Refill  . levothyroxine (SYNTHROID, LEVOTHROID) 100 MCG tablet Take 100 mcg by mouth daily before breakfast.    . montelukast (SINGULAIR) 10 MG tablet Take 10 mg by mouth at bedtime.    . predniSONE (DELTASONE) 10 MG tablet 6 tabs po day 1, 5 tabs po day 2, 4 tabs po day 3, 3 tabs po day 4, 2 tabs po day 5, 1 tab po day 6 21 tablet 0  . tiZANidine (ZANAFLEX) 4 MG tablet Take 1 tablet (4 mg total) by mouth every 6 (six) hours as needed for muscle spasms. 60 tablet 1  . traMADol (ULTRAM) 50 MG tablet Take 1 tablet (50 mg total) by mouth every 6 (six) hours as needed. 15 tablet 0   No current facility-administered medications on file prior to visit.     Past Surgical History:  Procedure Laterality Date  . BRAIN SURGERY    . COSMETIC SURGERY     Facial surgery due to dog bite  . ENDOMETRIAL ABLATION    .  NASAL SINUS SURGERY      No Known Allergies  Social History   Socioeconomic History  . Marital status: Divorced    Spouse name: Not on file  . Number of children: Not on file  . Years of education: Not on file  . Highest education level: Not on file  Social Needs  . Financial resource strain: Not on file  . Food insecurity - worry: Not on file  . Food insecurity - inability: Not on file  . Transportation needs - medical: Not on file  . Transportation needs - non-medical: Not on file  Occupational History  . Not on file  Tobacco Use  . Smoking status: Never Smoker  . Smokeless tobacco: Never Used  Substance and Sexual Activity  . Alcohol use: No  . Drug use: No  . Sexual activity: Yes    Birth control/protection: Surgical  Other Topics Concern  . Not on file  Social History Narrative  . Not on file    History reviewed. No pertinent family history.  BP 115/72   Pulse 82   Ht 5\' 5"  (1.651 m)   Wt 150 lb (68 kg)   LMP 03/29/2017   BMI 24.96 kg/m   Review of Systems: See HPI above.     Objective:  Physical Exam:  Gen: NAD, comfortable in  exam room.  Neck: No gross deformity, swelling, bruising. TTP right paraspinal cervical region, trapezius.  No midline/bony TTP. ROM limited to 5 degrees extension, 15 bilateral rotations, full flexion. BUE strength 5/5.   Sensation intact to light touch.   2+ equal reflexes in triceps, biceps, brachioradialis tendons. Negative spurlings. NV intact distal BUEs.  Right shoulder: No swelling, ecchymoses.  No gross deformity. No TTP. FROM but pain posterior shoulder with abduction. Negative Hawkins, Neers. Negative Yergasons. Strength 5/5 with empty can and resisted internal/external rotation. Negative apprehension. NV intact distally.  Assessment & Plan:  1. Neck/right shoulder pain - concerning she has not improved as expected with prednisone dose pack, tizanidine.  Also concerning she's having issues with weakness,  reflexive sharp pain in right arm.  Will go ahead with MRI to further assess. Naproxen with tizanidine in meantime.  Consider physical therapy.

## 2017-05-05 ENCOUNTER — Telehealth: Payer: Self-pay | Admitting: Family Medicine

## 2017-05-05 NOTE — Telephone Encounter (Signed)
Patient called regarding work restrictions. Wants to know how long she needs to continue the restrictions of no overhead motions and not lifting more than 5 pounds

## 2017-05-05 NOTE — Telephone Encounter (Signed)
I think the plan was that she needed a cervical spine MRI given her persistent pain and weakness and those results would dictate length of restrictions, next steps in her treatment.

## 2017-05-06 NOTE — Telephone Encounter (Signed)
Informed patient. She states she is starting to experience pain in her neck whenever she sneezes or coughs. She wants to be reexamined. I put her on the schedule for Tuesday, 3/19

## 2017-05-06 NOTE — Telephone Encounter (Signed)
Noted, will see her Tuesday.

## 2017-05-10 ENCOUNTER — Ambulatory Visit (INDEPENDENT_AMBULATORY_CARE_PROVIDER_SITE_OTHER): Payer: Self-pay | Admitting: Family Medicine

## 2017-05-10 ENCOUNTER — Encounter: Payer: Self-pay | Admitting: Family Medicine

## 2017-05-10 DIAGNOSIS — M542 Cervicalgia: Secondary | ICD-10-CM

## 2017-05-10 MED ORDER — TRAMADOL HCL 50 MG PO TABS: 50.0000 mg | ORAL_TABLET | Freq: Four times a day (QID) | ORAL | 0 refills | Status: DC | PRN

## 2017-05-10 MED ORDER — PREDNISONE 10 MG PO TABS: ORAL_TABLET | ORAL | 0 refills | Status: DC

## 2017-05-10 NOTE — Patient Instructions (Signed)
You have cervical radiculopathy (a pinched nerve in the neck). Take extended prednisone dose pack as directed. Don't take aleve or ibuprofen with this. Tizanidine if needed for spasms. Tramadol as needed for severe pain (no driving on this medicine). See if you qualify for cone coverage before applying with [facility] and/or medicaid. Simple range of motion exercises within limits of pain to prevent further stiffness. Watch head position when on computers, texting, when sleeping in bed - should in line with back to prevent further nerve traction and irritation.

## 2017-05-12 ENCOUNTER — Encounter: Payer: Self-pay | Admitting: Family Medicine

## 2017-05-12 DIAGNOSIS — M542 Cervicalgia: Secondary | ICD-10-CM | POA: Insufficient documentation

## 2017-05-12 NOTE — Progress Notes (Signed)
PCP: Charline Bills, MD  Subjective:   HPI: Patient is a 47 y.o. female here for neck/right shoulder injury.  2/21: Patient reports she's had problems with her right shoulder previously but 2 days ago she was pushing a heavy cart in a freezer at work and felt a sharp pop in posterior right shoulder radiating up to neck. Associated tingling, numbness in this area. Neck feels very stiff, 7/10 level of pain. Arm has improved but neck still a problem. Pain worse with neck motions but also with trying to reach overhead. No skin changes.  3/4: Patient returns with continued severe pain in right side of neck, posterior right shoulder. Pain level 7/10, sharp. Worse with turning head. Having weakness - almost dropped granddaughter extending her out with her arms. Finished prednisone and taking tizanidine. No bowel/bladder dysfunction.  3/19: Patient returns with persistent severe right sided neck, posterior shoulder pain. Pain is 8/10 level, sharp and throbbing. Done with prednisone and now taking naproxen, tizanidine with tramadol as needed. Cannot pick up a gallon of milk without pain and feeling like she's going to drop this. Pain also noted to be worse when sneezing. No bowel or bladder dysfunction.  Past Medical History:  Diagnosis Date  . Brain tumor (benign) ([place])   . Chronic right shoulder pain   . Diverticulitis   . Migraines   . Thyroid disease   . Vertigo     Current Outpatient Medications on File Prior to Visit  Medication Sig Dispense Refill  . levothyroxine (SYNTHROID, LEVOTHROID) 100 MCG tablet Take 100 mcg by mouth daily before breakfast.    . montelukast (SINGULAIR) 10 MG tablet Take 10 mg by mouth at bedtime.    . naproxen (NAPROSYN) 500 MG tablet Take 1 tablet (500 mg total) by mouth 2 (two) times daily as needed. 60 tablet 2  . tiZANidine (ZANAFLEX) 4 MG tablet Take 1 tablet (4 mg total) by mouth every 6 (six) hours as needed for muscle spasms. 60 tablet 1    No current facility-administered medications on file prior to visit.     Past Surgical History:  Procedure Laterality Date  . BRAIN SURGERY    . COSMETIC SURGERY     Facial surgery due to dog bite  . ENDOMETRIAL ABLATION    . NASAL SINUS SURGERY      No Known Allergies  Social History   Socioeconomic History  . Marital status: Divorced    Spouse name: Not on file  . Number of children: Not on file  . Years of education: Not on file  . Highest education level: Not on file  Occupational History  . Not on file  Social Needs  . Financial resource strain: Not on file  . Food insecurity:    Worry: Not on file    Inability: Not on file  . Transportation needs:    Medical: Not on file    Non-medical: Not on file  Tobacco Use  . Smoking status: Never Smoker  . Smokeless tobacco: Never Used  Substance and Sexual Activity  . Alcohol use: No  . Drug use: No  . Sexual activity: Yes    Birth control/protection: Surgical  Lifestyle  . Physical activity:    Days per week: Not on file    Minutes per session: Not on file  . Stress: Not on file  Relationships  . Social connections:    Talks on phone: Not on file    Gets together: Not on file  Attends religious service: Not on file    Active member of club or organization: Not on file    Attends meetings of clubs or organizations: Not on file    Relationship status: Not on file  . Intimate partner violence:    Fear of current or ex partner: Not on file    Emotionally abused: Not on file    Physically abused: Not on file    Forced sexual activity: Not on file  Other Topics Concern  . Not on file  Social History Narrative  . Not on file    History reviewed. No pertinent family history.  BP 114/75   Pulse 76   Ht 5\' 5"  (1.651 m)   Wt 150 lb (68 kg)   BMI 24.96 kg/m   Review of Systems: See HPI above.     Objective:  Physical Exam:  Gen: NAD, comfortable in exam room.  Neck: No gross deformity,  swelling, bruising. Tenderness to palpation right paraspinal cervical region and trapezius.  No midline or bony tenderness. Range of motion limited to 10 degrees of flexion, 5 degrees of extension, 20 degrees of bilateral rotations. Bilateral upper extremity strength 5 out of 5. Sensation intact light touch bilaterally. Neurovascularly intact distally bilateral upper extremities.  Bilateral shoulders: No deformity. FROM with 5/5 strength. No tenderness to palpation. NVI distally.  Assessment & Plan:  1. Neck/right shoulder pain -still consistent with cervical radiculopathy.  As noted previously she had not improved as expected with prednisone Dosepak and tizanidine.  She will try the extended course of prednisone.  Tizanidine and tramadol as needed.  Advised not to take Aleve or ibuprofen with the prednisone.  Discussed ergonomic issues.  Biggest concern is her lack of insurance coverage as we cannot go ahead with an MRI at this time.  She was given the cone coverage and also asked her to inquire with [facility] if she does not qualify through [facility].  She has started some paperwork to get Medicaid though this usually takes about 45 days to go through.

## 2017-05-12 NOTE — Assessment & Plan Note (Signed)
still consistent with cervical radiculopathy.  As noted previously she had not improved as expected with prednisone Dosepak and tizanidine.  She will try the extended course of prednisone.  Tizanidine and tramadol as needed.  Advised not to take Aleve or ibuprofen with the prednisone.  Discussed ergonomic issues.  Biggest concern is her lack of insurance coverage as we cannot go ahead with an MRI at this time.  She was given the cone coverage and also asked her to inquire with [facility] if she does not qualify through [facility].  She has started some paperwork to get Medicaid though this usually takes about 45 days to go through.

## 2017-11-09 ENCOUNTER — Encounter (HOSPITAL_BASED_OUTPATIENT_CLINIC_OR_DEPARTMENT_OTHER): Payer: Self-pay | Admitting: Emergency Medicine

## 2017-11-09 ENCOUNTER — Other Ambulatory Visit: Payer: Self-pay

## 2017-11-09 ENCOUNTER — Emergency Department (HOSPITAL_BASED_OUTPATIENT_CLINIC_OR_DEPARTMENT_OTHER)
Admission: EM | Admit: 2017-11-09 | Discharge: 2017-11-09 | Disposition: A | Discharge status: Home / Self Care | Payer: Self-pay | Attending: Emergency Medicine | Admitting: Emergency Medicine

## 2017-11-09 DIAGNOSIS — R197 Diarrhea, unspecified: Secondary | ICD-10-CM | POA: Insufficient documentation

## 2017-11-09 DIAGNOSIS — R109 Unspecified abdominal pain: Secondary | ICD-10-CM | POA: Insufficient documentation

## 2017-11-09 DIAGNOSIS — R51 Headache: Secondary | ICD-10-CM | POA: Insufficient documentation

## 2017-11-09 DIAGNOSIS — R319 Hematuria, unspecified: Secondary | ICD-10-CM | POA: Insufficient documentation

## 2017-11-09 DIAGNOSIS — D332 Benign neoplasm of brain, unspecified: Secondary | ICD-10-CM | POA: Insufficient documentation

## 2017-11-09 DIAGNOSIS — R112 Nausea with vomiting, unspecified: Secondary | ICD-10-CM | POA: Insufficient documentation

## 2017-11-09 DIAGNOSIS — Z79899 Other long term (current) drug therapy: Secondary | ICD-10-CM | POA: Insufficient documentation

## 2017-11-09 LAB — COMPREHENSIVE METABOLIC PANEL
ALK PHOS: 45 U/L (ref 38–126)
ALT: 14 U/L (ref 0–44)
ANION GAP: 9 (ref 5–15)
AST: 22 U/L (ref 15–41)
Albumin: 4.1 g/dL (ref 3.5–5.0)
BILIRUBIN TOTAL: 0.4 mg/dL (ref 0.3–1.2)
BUN: 16 mg/dL (ref 6–20)
CALCIUM: 8.7 mg/dL — AB (ref 8.9–10.3)
CO2: 21 mmol/L — ABNORMAL LOW (ref 22–32)
Chloride: 108 mmol/L (ref 98–111)
Creatinine, Ser: 0.68 mg/dL (ref 0.44–1.00)
GFR calc non Af Amer: >60 mL/min (ref >60)
Glucose, Bld: 157 mg/dL — ABNORMAL HIGH (ref 70–99)
Potassium: 3.8 mmol/L (ref 3.5–5.1)
Sodium: 138 mmol/L (ref 135–145)
TOTAL PROTEIN: 7.4 g/dL (ref 6.5–8.1)

## 2017-11-09 LAB — CBC WITH DIFFERENTIAL/PLATELET
Basophils Absolute: 0 10*3/uL (ref 0.0–0.1)
Basophils Relative: 0 %
Eosinophils Absolute: 0.1 10*3/uL (ref 0.0–0.7)
Eosinophils Relative: 1 %
HEMATOCRIT: 42.4 % (ref 36.0–46.0)
HEMOGLOBIN: 14.2 g/dL (ref 12.0–15.0)
LYMPHS ABS: 1.9 10*3/uL (ref 0.7–4.0)
Lymphocytes Relative: 31 %
MCH: 32.3 pg (ref 26.0–34.0)
MCHC: 33.5 g/dL (ref 30.0–36.0)
MCV: 96.4 fL (ref 78.0–100.0)
MONO ABS: 0.4 10*3/uL (ref 0.1–1.0)
MONOS PCT: 7 %
NEUTROS ABS: 3.7 10*3/uL (ref 1.7–7.7)
NEUTROS PCT: 61 %
Platelets: 267 10*3/uL (ref 150–400)
RBC: 4.4 MIL/uL (ref 3.87–5.11)
RDW: 12 % (ref 11.5–15.5)
WBC: 6.1 10*3/uL (ref 4.0–10.5)

## 2017-11-09 LAB — URINALYSIS, ROUTINE W REFLEX MICROSCOPIC
BILIRUBIN URINE: NEGATIVE
Glucose, UA: NEGATIVE mg/dL
Hgb urine dipstick: NEGATIVE
KETONES UR: NEGATIVE mg/dL
Leukocytes, UA: NEGATIVE
NITRITE: NEGATIVE
PH: 6 (ref 5.0–8.0)
PROTEIN: NEGATIVE mg/dL
Specific Gravity, Urine: >1.03 — ABNORMAL HIGH (ref 1.005–1.030)

## 2017-11-09 LAB — PREGNANCY, URINE: PREG TEST UR: NEGATIVE

## 2017-11-09 LAB — LIPASE, BLOOD: Lipase: 33 U/L (ref 11–51)

## 2017-11-09 MED ORDER — LOPERAMIDE HCL 2 MG PO CAPS
2.0000 mg | ORAL_CAPSULE | Freq: Once | ORAL | Status: AC
  Administered 2017-11-09: 2 mg via ORAL
  Filled 2017-11-09: qty 1

## 2017-11-09 MED ORDER — SODIUM CHLORIDE 0.9 % IV BOLUS (SEPSIS)
1000.0000 mL | Freq: Once | INTRAVENOUS | Status: AC
  Administered 2017-11-09: 1000 mL via INTRAVENOUS

## 2017-11-09 MED ORDER — ACETAMINOPHEN 500 MG PO TABS
1000.0000 mg | ORAL_TABLET | Freq: Once | ORAL | Status: AC
  Administered 2017-11-09: 1000 mg via ORAL
  Filled 2017-11-09: qty 2

## 2017-11-09 MED ORDER — ONDANSETRON 4 MG PO TBDP: 4.0000 mg | ORAL_TABLET | Freq: Four times a day (QID) | ORAL | 0 refills | Status: AC | PRN

## 2017-11-09 MED ORDER — ONDANSETRON HCL 4 MG/2ML IJ SOLN
4.0000 mg | Freq: Once | INTRAMUSCULAR | Status: AC
  Administered 2017-11-09: 4 mg via INTRAVENOUS
  Filled 2017-11-09: qty 2

## 2017-11-09 MED ORDER — KETOROLAC TROMETHAMINE 15 MG/ML IJ SOLN
15.0000 mg | Freq: Once | INTRAMUSCULAR | Status: AC
  Administered 2017-11-09: 15 mg via INTRAVENOUS
  Filled 2017-11-09: qty 1

## 2017-11-09 MED FILL — ONDANSETRON ODT 4 MG TABLET: 4 | 5 days supply | Qty: 20 | Fill #0

## 2017-11-09 NOTE — ED Provider Notes (Signed)
TIME SEEN: 6:37 AM  CHIEF COMPLAINT: Nausea, vomiting, diarrhea  HPI: Patient is a 47 year old female who presents to the emergency department with nausea, vomiting and diarrhea that started at noon yesterday.  No bad food exposure, sick contact or recent travel.  No known fever.  Reports diffuse abdominal soreness from throwing up.  States she has noticed 2 to 3 days of hematuria but no dysuria, vaginal bleeding or discharge.  Has had uterine ablation but no other abdominal surgeries.  ROS: See HPI Constitutional: no fever  Eyes: no drainage  ENT: no runny nose   Cardiovascular:  no chest pain  Resp: no SOB  GI: Vomiting and diarrhea GU: no dysuria Integumentary: no rash  Allergy: no hives  Musculoskeletal: no leg swelling  Neurological: no slurred speech ROS otherwise negative  PAST MEDICAL HISTORY/PAST SURGICAL HISTORY:  Past Medical History:  Diagnosis Date  . Brain tumor (benign) ([place])   . Chronic right shoulder pain   . Diverticulitis   . Migraines   . Thyroid disease   . Vertigo     MEDICATIONS:  Prior to Admission medications   Medication Sig Start Date End Date Taking? Authorizing Provider  levothyroxine (SYNTHROID, LEVOTHROID) 100 MCG tablet Take 100 mcg by mouth daily before breakfast.    [provider]  montelukast (SINGULAIR) 10 MG tablet Take 10 mg by mouth at bedtime.    [provider]  naproxen (NAPROSYN) 500 MG tablet Take 1 tablet (500 mg total) by mouth 2 (two) times daily as needed. 04/25/17   Hudnall, Sharyn Lull, MD  predniSONE (DELTASONE) 10 MG tablet 6 tabs po days 1-2, 5 tabs po days 3-4, 4 tabs po days 5-6, 3 tabs po days 7-8, 2 tabs po days 9-10, 1 tab po days 11-12 05/10/17   Hudnall, Sharyn Lull, MD  tiZANidine (ZANAFLEX) 4 MG tablet Take 1 tablet (4 mg total) by mouth every 6 (six) hours as needed for muscle spasms. 04/14/17   Hudnall, Sharyn Lull, MD  traMADol (ULTRAM) 50 MG tablet Take 1 tablet (50 mg total) by mouth every 6 (six) hours as  needed. 05/10/17   Dene Gentry, MD    ALLERGIES:  No Known Allergies  SOCIAL HISTORY:  Social History   Tobacco Use  . Smoking status: Never Smoker  . Smokeless tobacco: Never Used  Substance Use Topics  . Alcohol use: No    FAMILY HISTORY: No family history on file.  EXAM: BP 111/68 (BP Location: Left Arm)   Pulse 66   Temp 97.9 F (36.6 C) (Oral)   Resp 18   Ht 5\' 5"  (1.651 m)   Wt 68.9 kg   SpO2 100%   BMI 25.29 kg/m  CONSTITUTIONAL: Alert and oriented and responds appropriately to questions. Well-appearing; well-nourished HEAD: Normocephalic EYES: Conjunctivae clear, pupils appear equal, EOMI ENT: normal nose; moist mucous membranes NECK: Supple, no meningismus, no nuchal rigidity, no LAD  CARD: RRR; S1 and S2 appreciated; no murmurs, no clicks, no rubs, no gallops RESP: Normal chest excursion without splinting or tachypnea; breath sounds clear and equal bilaterally; no wheezes, no rhonchi, no rales, no hypoxia or respiratory distress, speaking full sentences ABD/GI: Normal bowel sounds; non-distended; soft, non-tender, no rebound, no guarding, no peritoneal signs, no hepatosplenomegaly BACK:  The back appears normal and is non-tender to palpation, there is no CVA tenderness EXT: Normal ROM in all joints; non-tender to palpation; no edema; normal capillary refill; no cyanosis, no calf tenderness or swelling    SKIN:  Normal color for age and race; warm; no rash NEURO: Moves all extremities equally PSYCH: The patient's mood and manner are appropriate. Grooming and personal hygiene are appropriate.  MEDICAL DECISION MAKING: Patient here with likely viral gastroenteritis.  Abdominal exam completely benign.  Will obtain labs, urine, urine pregnancy test.  Treat symptomatically with IV fluids, Zofran.  ED PROGRESS: Urine unremarkable.  Pregnancy test negative.  Labs pending.  Signed out to Dr. Tyrone Nine.  Anticipate discharge home once symptoms have improved and tolerating  oral fluids.   I reviewed all nursing notes, vitals, pertinent previous records, EKGs, lab and urine results, imaging (as available).      Ward, Delice Bison, DO 11/09/17 [phone]

## 2017-11-09 NOTE — ED Notes (Signed)
Sipping on fluids at bedside

## 2017-11-09 NOTE — Discharge Instructions (Signed)
You may alternate Tylenol 1000 mg every 6 hours as needed for pain and Ibuprofen 800 mg every 8 hours as needed for pain.  Please take Ibuprofen with food. ° °You may use over-the-counter Imodium as needed for diarrhea. °

## 2017-11-09 NOTE — ED Provider Notes (Signed)
I received the patient in signout from Dr. Leonides Schanz.  Briefly patient is a 47 year old female with a chief complaint of nausea vomiting diarrhea.  This started yesterday evening.  Has gotten somewhat better this morning but when she went to go to work she continued to vomit.  Plan is to reassess post labs with oral trial.  Complaining mostly of headache on my exam.  No focal neurologic deficit.  Labs had returned and were unremarkable.  No LFT elevation lipase is normal.  No leukocytosis.  UA without ketones or infection.  We will give the patient a dose of Tylenol and Toradol.  She still has some fluids that are currently running and that we will let finish. Ambulating without difficulty, some improvement of HA.   8:00 AM:  I have discussed the diagnosis/risks/treatment options with the patient and believe the pt to be eligible for discharge home to follow-up with PCP. We also discussed returning to the ED immediately if new or worsening sx occur. We discussed the sx which are most concerning (e.g., sudden worsening pain, fever, inability to tolerate by mouth) that necessitate immediate return. Medications administered to the patient during their visit and any new prescriptions provided to the patient are listed below.  Medications given during this visit Medications  ondansetron (ZOFRAN) injection 4 mg (4 mg Intravenous Given 11/09/17 0644)  sodium chloride 0.9 % bolus 1,000 mL (0 mLs Intravenous Stopped 11/09/17 0747)  loperamide (IMODIUM) capsule 2 mg (2 mg Oral Given 11/09/17 6440)  acetaminophen (TYLENOL) tablet 1,000 mg (1,000 mg Oral Given 11/09/17 0728)  ketorolac (TORADOL) 15 MG/ML injection 15 mg (15 mg Intravenous Given 11/09/17 3474)     The patient appears reasonably screen and/or stabilized for discharge and I doubt any other medical condition or other [facility] requiring further screening, evaluation, or treatment in the ED at this time prior to discharge.     Deno Etienne, DO 11/09/17 0800

## 2017-11-09 NOTE — ED Triage Notes (Signed)
Pt c/o nausea and vomiting since yesterday noon time. Pt denies any fever or chills.

## 2017-11-09 NOTE — ED Notes (Signed)
PO challenge started

## 2017-12-08 ENCOUNTER — Ambulatory Visit (HOSPITAL_BASED_OUTPATIENT_CLINIC_OR_DEPARTMENT_OTHER)
Admission: RE | Admit: 2017-12-08 | Discharge: 2017-12-08 | Disposition: A | Discharge status: Home / Self Care | Payer: 59 | Source: Ambulatory Visit | Attending: Family Medicine | Admitting: Family Medicine

## 2017-12-08 ENCOUNTER — Ambulatory Visit (INDEPENDENT_AMBULATORY_CARE_PROVIDER_SITE_OTHER): Payer: 59 | Admitting: Family Medicine

## 2017-12-08 ENCOUNTER — Encounter: Payer: Self-pay | Admitting: Family Medicine

## 2017-12-08 VITALS — BP 106/74 | HR 71 | Ht 65.0 in | Wt 163.0 lb

## 2017-12-08 DIAGNOSIS — M542 Cervicalgia: Secondary | ICD-10-CM

## 2017-12-08 DIAGNOSIS — M5030 Other cervical disc degeneration, unspecified cervical region: Secondary | ICD-10-CM | POA: Insufficient documentation

## 2017-12-08 IMAGING — DX DG CERVICAL SPINE COMPLETE 4+V
5 series · 5 of 5 positions shown · non-contrast
Comparison: None.

CLINICAL DATA: Chronic neck pain and right upper extremity
numbness.

EXAM:
CERVICAL SPINE - COMPLETE 4+ VIEW

[c-spine lat]
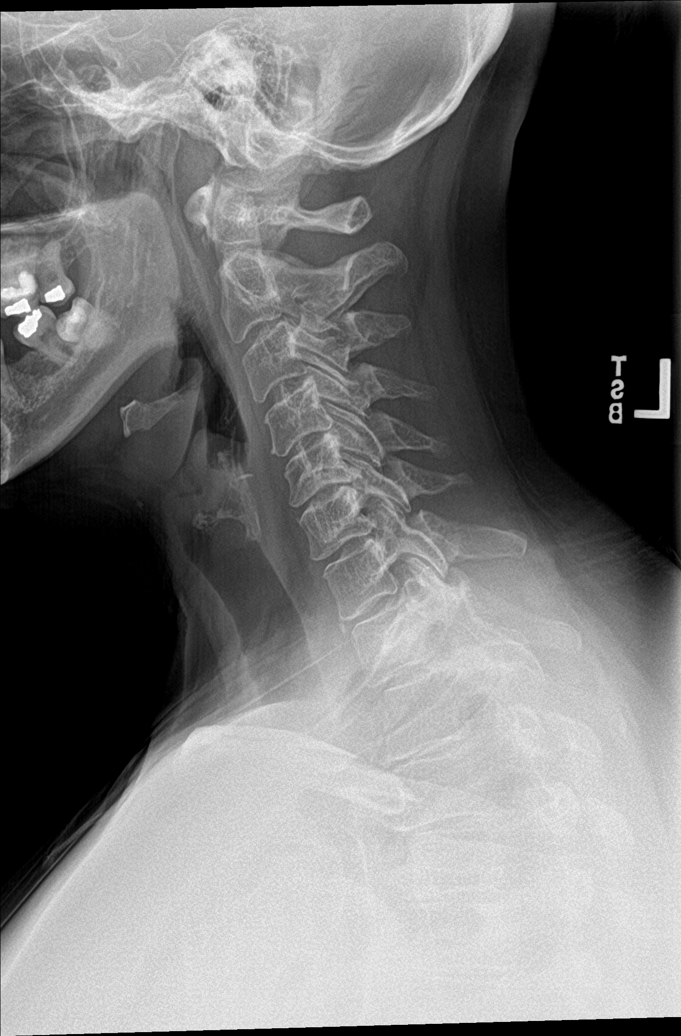

[c-spine obl (1 of 2)]
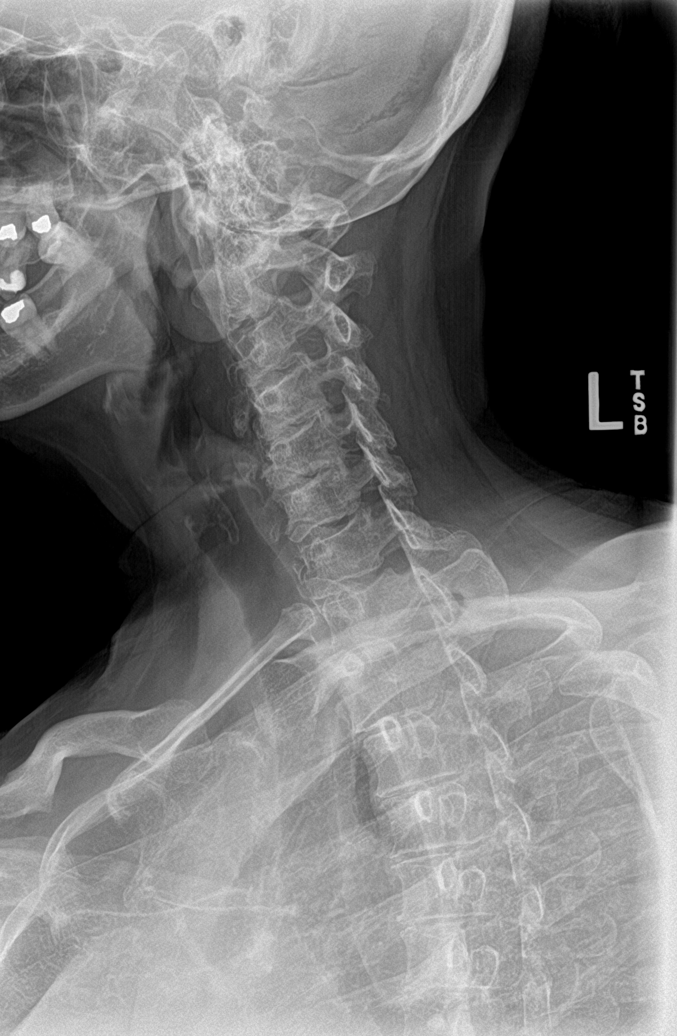

[c-spine obl (2 of 2)]
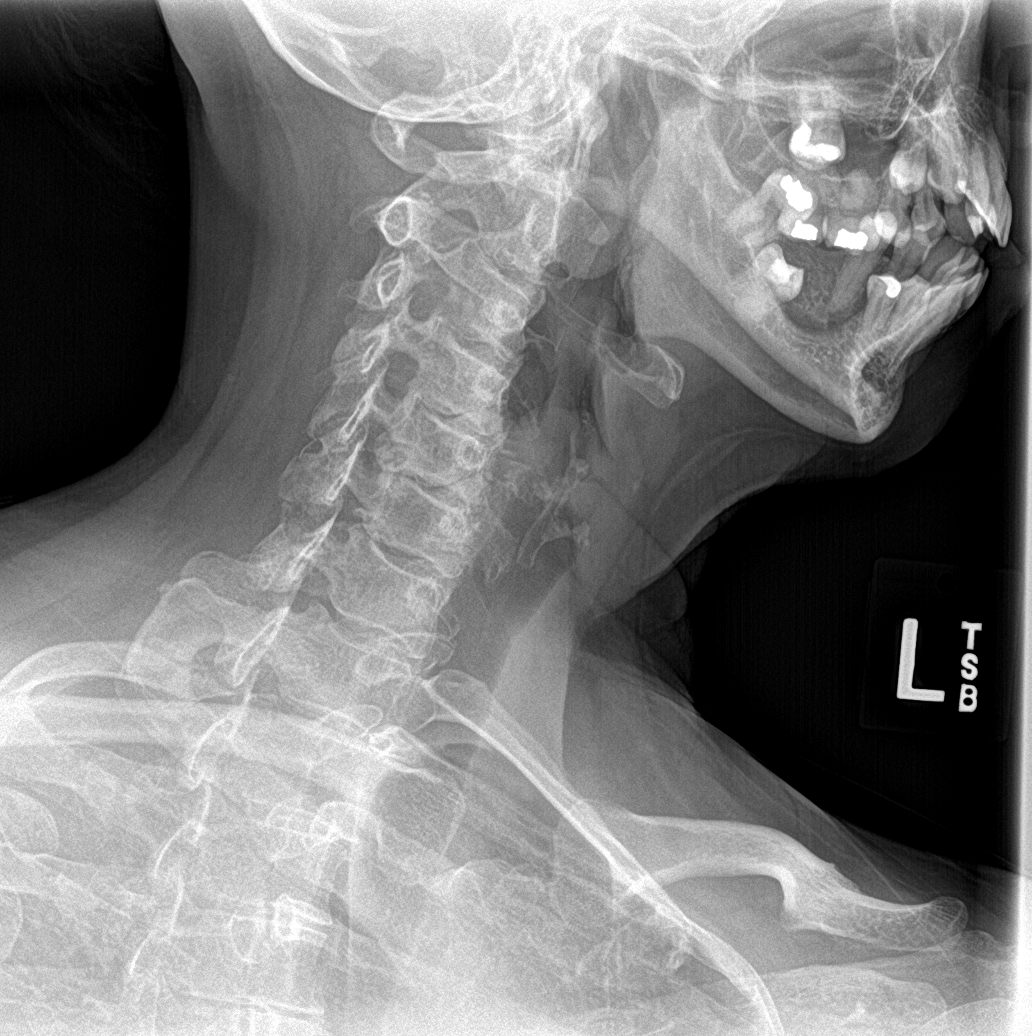

[c-spine ap]
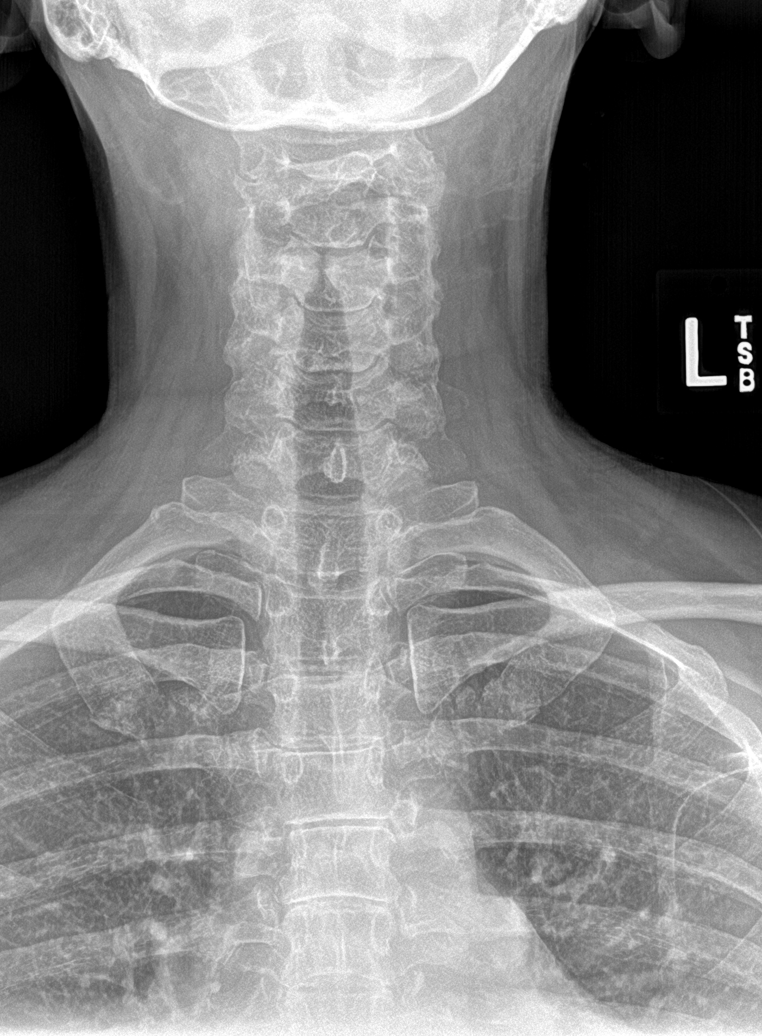

[c-spine open mouth]
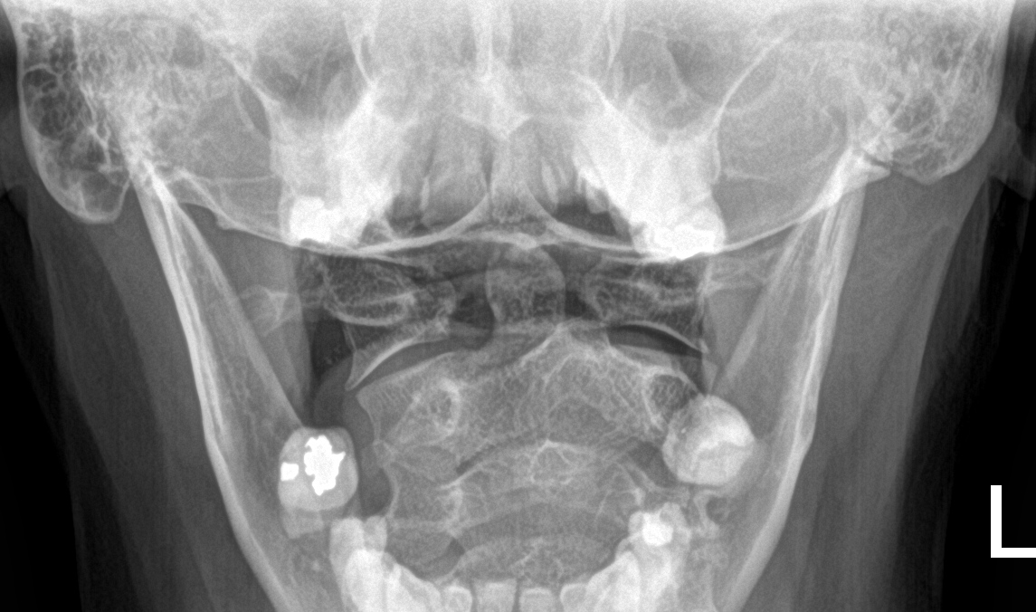

[5 of 5 positions shown; findings below may reference images not displayed]

FINDINGS: There is straightening of the normal cervical lordosis without
listhesis. No fracture is identified. Mild disc space narrowing and
degenerative spurring are present from C4-T1. Mild osseous neural
foraminal narrowing at C5-6 and C6-7 is more notable on the right.
The prevertebral soft tissues are within normal limits. The lung
apices are clear.
IMPRESSION: Mild cervical disc degeneration without acute osseous abnormality.

## 2017-12-08 MED ORDER — IBUPROFEN 800 MG PO TABS: 800.0000 mg | ORAL_TABLET | Freq: Three times a day (TID) | ORAL | 1 refills | Status: DC | PRN

## 2017-12-08 NOTE — Patient Instructions (Addendum)
You have cervical radiculopathy (a pinched nerve in the neck). Get x-rays downstairs as you leave today - we will contact you with the results and set up an MRI for you. Take ibuprofen up to three times a day with food. Simple range of motion exercises within limits of pain to prevent further stiffness. Watch head position when on computers, texting, when sleeping in bed - should in line with back to prevent further nerve traction and irritation. Follow up will depend on the MRI.

## 2017-12-11 ENCOUNTER — Encounter: Payer: Self-pay | Admitting: Family Medicine

## 2017-12-11 NOTE — Progress Notes (Addendum)
PCP: Charline Bills, MD  Subjective:   HPI: Patient is a 47 y.o. female here for neck/right shoulder injury.  2/21: Patient reports she's had problems with her right shoulder previously but 2 days ago she was pushing a heavy cart in a freezer at work and felt a sharp pop in posterior right shoulder radiating up to neck. Associated tingling, numbness in this area. Neck feels very stiff, 7/10 level of pain. Arm has improved but neck still a problem. Pain worse with neck motions but also with trying to reach overhead. No skin changes.  3/4: Patient returns with continued severe pain in right side of neck, posterior right shoulder. Pain level 7/10, sharp. Worse with turning head. Having weakness - almost dropped granddaughter extending her out with her arms. Finished prednisone and taking tizanidine. No bowel/bladder dysfunction.  3/19: Patient returns with persistent severe right sided neck, posterior shoulder pain. Pain is 8/10 level, sharp and throbbing. Done with prednisone and now taking naproxen, tizanidine with tramadol as needed. Cannot pick up a gallon of milk without pain and feeling like she's going to drop this. Pain also noted to be worse when sneezing. No bowel or bladder dysfunction.  10/17: Patient returns with continued problems right side of neck down right arm. Pain level 9/10, sharp. Motion of neck limited. Difficulty picking items up still. She never improved after last visit but has finally been able to get health insurance. Associated tingling and numbness down arm into all digits.  Past Medical History:  Diagnosis Date  . Brain tumor (benign) ([place])   . Chronic right shoulder pain   . Diverticulitis   . Migraines   . Thyroid disease   . Vertigo     Current Outpatient Medications on File Prior to Visit  Medication Sig Dispense Refill  . SUMAtriptan (IMITREX) 100 MG tablet TAKE ONE TABLET FOR MIGRAINE RELIEF. MAY REPEAT 2 HOURS LATER. MAXIMUM 2 PER  DAY.    Marland Kitchen amitriptyline (ELAVIL) 25 MG tablet Take 25 mg by mouth at bedtime.  6  . levothyroxine (SYNTHROID, LEVOTHROID) 100 MCG tablet Take 100 mcg by mouth daily before breakfast.    . montelukast (SINGULAIR) 10 MG tablet Take 10 mg by mouth at bedtime.    . ondansetron (ZOFRAN ODT) 4 MG disintegrating tablet Take 1 tablet (4 mg total) by mouth every 6 (six) hours as needed for nausea or vomiting. 20 tablet 0   No current facility-administered medications on file prior to visit.     Past Surgical History:  Procedure Laterality Date  . BRAIN SURGERY    . COSMETIC SURGERY     Facial surgery due to dog bite  . ENDOMETRIAL ABLATION    . NASAL SINUS SURGERY      Allergies  Allergen Reactions  . Morphine Swelling    Social History   Socioeconomic History  . Marital status: Divorced    Spouse name: Not on file  . Number of children: Not on file  . Years of education: Not on file  . Highest education level: Not on file  Occupational History  . Not on file  Social Needs  . Financial resource strain: Not on file  . Food insecurity:    Worry: Not on file    Inability: Not on file  . Transportation needs:    Medical: Not on file    Non-medical: Not on file  Tobacco Use  . Smoking status: Never Smoker  . Smokeless tobacco: Never Used  Substance and Sexual Activity  .  Alcohol use: No  . Drug use: No  . Sexual activity: Yes    Birth control/protection: Surgical  Lifestyle  . Physical activity:    Days per week: Not on file    Minutes per session: Not on file  . Stress: Not on file  Relationships  . Social connections:    Talks on phone: Not on file    Gets together: Not on file    Attends religious service: Not on file    Active member of club or organization: Not on file    Attends meetings of clubs or organizations: Not on file    Relationship status: Not on file  . Intimate partner violence:    Fear of current or ex partner: Not on file    Emotionally abused:  Not on file    Physically abused: Not on file    Forced sexual activity: Not on file  Other Topics Concern  . Not on file  Social History Narrative  . Not on file    History reviewed. No pertinent family history.  BP 106/74   Pulse 71   Ht 5\' 5"  (1.651 m)   Wt 163 lb (73.9 kg)   BMI 27.12 kg/m   Review of Systems: See HPI above.     Objective:  Physical Exam:  Gen: NAD, comfortable in exam room  Neck: No gross deformity, swelling, bruising. TTP right cervical paraspinal region and trapezius.  No midline/bony TTP. ROM limited to 5 degrees extension; 20 flexion, 20 bilateral rotations. 3/5 right elbow extension.  5/5 all other upper extremity muscle groups bilaterally. Sensation intact to light touch.   2+ equal reflexes in triceps, biceps, brachioradialis tendons. Negative spurlings. 2+ radial pulses.  Assessment & Plan:  1. Neck/right shoulder pain - 2/2 cervical radiculopathy not responsive to prednisone, tizanidine, tramadol, NSAIDs and with weakness of elbow extension on right now.  Will go ahead with MRI.  Ibuprofen tid in meantime.   Addendum:  MRI reviewed and discussed with patient.  She has disc osteophyte complexes at multiple levels C4-5 through C6-7 but worst is at C5-6 with severe right foraminal stenosis and moderate spinal stenosis.  We discussed options and she would like to try an ESI, call us a week following this.  We also discussed physical therapy, medications, neurosurgery referral.

## 2017-12-12 ENCOUNTER — Encounter: Payer: Self-pay | Admitting: Family Medicine

## 2017-12-12 ENCOUNTER — Ambulatory Visit: Payer: 59 | Admitting: Family Medicine

## 2017-12-12 VITALS — BP 123/79 | HR 71 | Ht 65.0 in | Wt 167.0 lb

## 2017-12-12 DIAGNOSIS — M533 Sacrococcygeal disorders, not elsewhere classified: Secondary | ICD-10-CM | POA: Diagnosis not present

## 2017-12-12 NOTE — Patient Instructions (Signed)
You have a sacral contusion vs hairline fracture of your sacrum. Tylenol 500mg  1-2 tabs three times a day for pain. Ibuprofen 600mg  three times a day with food for pain and inflammation. Tramadol as needed for pain - no driving on this. Salon pas patches topically. Consider a donut pad or innertube to sit on for comfort Out of work for a week - follow up with me at that time.

## 2017-12-12 NOTE — Progress Notes (Signed)
PCP: Charline Bills, MD  Subjective:   HPI: Patient is a 47 y.o. female here for sacral pain.  Patient states that at work on 12/09/2017 she stepped backward, tripped, and fell onto her sacrum.  She remained at work for the rest of her shift which is about 2 hours.  She reports increased pain that night and the following day.  She finally went to the emergency department about 36 hours after the fall because the pain was intense enough that it made it difficult for her to sleep.  In the emergency department, she had imaging of her head, cervical spine, lumbar spine and sacrum.  She was diagnosed with a sacral fracture.  Today, she reports continued 10/10 constant sharp pain that is worse with any activity.  Her pain is located over the sacrum and over the bilateral buttocks.  Initially she reports having had some nonspecific numbness/tingling in the right lower extremity which has since resolved.  She denies any weakness in the lower extremity.  No red flag symptoms reported.  She has been using 1/2 tablet of tramadol and ibuprofen as needed for pain which have helped.  She was also prescribed lidocaine patches at the emergency department.  Past Medical History:  Diagnosis Date  . Brain tumor (benign) ([place])   . Chronic right shoulder pain   . Diverticulitis   . Migraines   . Thyroid disease   . Vertigo     Current Outpatient Medications on File Prior to Visit  Medication Sig Dispense Refill  . amitriptyline (ELAVIL) 25 MG tablet Take 25 mg by mouth at bedtime.  6  . ibuprofen (ADVIL,MOTRIN) 800 MG tablet Take 1 tablet (800 mg total) by mouth every 8 (eight) hours as needed. 90 tablet 1  . levothyroxine (SYNTHROID, LEVOTHROID) 100 MCG tablet Take 100 mcg by mouth daily before breakfast.    . montelukast (SINGULAIR) 10 MG tablet Take 10 mg by mouth at bedtime.    . ondansetron (ZOFRAN ODT) 4 MG disintegrating tablet Take 1 tablet (4 mg total) by mouth every 6 (six) hours as needed for nausea  or vomiting. 20 tablet 0  . SUMAtriptan (IMITREX) 100 MG tablet TAKE ONE TABLET FOR MIGRAINE RELIEF. MAY REPEAT 2 HOURS LATER. MAXIMUM 2 PER DAY.     No current facility-administered medications on file prior to visit.     Past Surgical History:  Procedure Laterality Date  . BRAIN SURGERY    . COSMETIC SURGERY     Facial surgery due to dog bite  . ENDOMETRIAL ABLATION    . NASAL SINUS SURGERY      Allergies  Allergen Reactions  . Morphine Swelling    Social History   Socioeconomic History  . Marital status: Divorced    Spouse name: Not on file  . Number of children: Not on file  . Years of education: Not on file  . Highest education level: Not on file  Occupational History  . Not on file  Social Needs  . Financial resource strain: Not on file  . Food insecurity:    Worry: Not on file    Inability: Not on file  . Transportation needs:    Medical: Not on file    Non-medical: Not on file  Tobacco Use  . Smoking status: Never Smoker  . Smokeless tobacco: Never Used  Substance and Sexual Activity  . Alcohol use: No  . Drug use: No  . Sexual activity: Yes    Birth control/protection: Surgical  Lifestyle  .  Physical activity:    Days per week: Not on file    Minutes per session: Not on file  . Stress: Not on file  Relationships  . Social connections:    Talks on phone: Not on file    Gets together: Not on file    Attends religious service: Not on file    Active member of club or organization: Not on file    Attends meetings of clubs or organizations: Not on file    Relationship status: Not on file  . Intimate partner violence:    Fear of current or ex partner: Not on file    Emotionally abused: Not on file    Physically abused: Not on file    Forced sexual activity: Not on file  Other Topics Concern  . Not on file  Social History Narrative  . Not on file    No family history on file.  BP 123/79   Pulse 71   Ht 5\' 5"  (1.651 m)   Wt 167 lb (75.8 kg)    BMI 27.79 kg/m   Review of Systems: See HPI above.     Objective:  Physical Exam:  Gen: awake, alert, NAD, comfortable in exam room Pulm: breathing unlabored  Lumbar spine: - Inspection: no gross deformity or asymmetry, swelling or ecchymosis - Palpation: Exquisitely tender over the sacrum and into the buttocks bilaterally - ROM: full active ROM of the lumbar spine in flexion and extension without pain - Strength: 5/5 strength of lower extremity in L4-S1 nerve root distributions b/l - Neuro: sensation intact in the L4-S1 nerve root distribution b/l, 2+ L4 and S1 reflexes - Special testing: Negative straight leg raise  Bilateral hips: No deformity. FROM with 5/5 strength. No tenderness to palpation. NVI distally. Negative logroll bilaterally.   Assessment & Plan:  1.  Sacrococcygeal pain secondary to fall- x-rays from the emergency department were independently reviewed today.  There is some anterior cortical irregularity on the AP view of the sacrum.  Based on mechanism of action, one would expect to visualize a fracture extending through the posterior aspect of the sacrum.  It is unclear as to whether this is a true fracture.  Nonetheless, she will be managed as such. -Continue tramadol and ibuprofen as needed for pain - Ibuprofen if needed -Continue lidocaine patch - Donut pad if needed - Patient unable to work until cleared. - Follow-up 1 week

## 2017-12-13 ENCOUNTER — Encounter: Payer: Self-pay | Admitting: Family Medicine

## 2017-12-19 ENCOUNTER — Encounter: Payer: Self-pay | Admitting: Family Medicine

## 2017-12-19 ENCOUNTER — Ambulatory Visit (INDEPENDENT_AMBULATORY_CARE_PROVIDER_SITE_OTHER): Payer: 59 | Admitting: Family Medicine

## 2017-12-19 VITALS — BP 107/71 | HR 73 | Ht 65.0 in | Wt 160.0 lb

## 2017-12-19 DIAGNOSIS — M533 Sacrococcygeal disorders, not elsewhere classified: Secondary | ICD-10-CM

## 2017-12-19 MED ORDER — TRAMADOL HCL 50 MG PO TABS: 50.0000 mg | ORAL_TABLET | Freq: Four times a day (QID) | ORAL | 0 refills | Status: DC | PRN

## 2017-12-19 NOTE — Progress Notes (Signed)
PCP: Charline Bills, MD  Subjective:   HPI: 10/21 - Patient is a 47 y.o. female here for sacral pain.  Patient states that at work on 12/09/2017 she stepped backward, tripped, and fell onto her sacrum.  She remained at work for the rest of her shift which is about 2 hours.  She reports increased pain that night and the following day.  She finally went to the emergency department about 36 hours after the fall because the pain was intense enough that it made it difficult for her to sleep.  In the emergency department, she had imaging of her head, cervical spine, lumbar spine and sacrum.  She was diagnosed with a sacral fracture.  Today, she reports continued 10/10 constant sharp pain that is worse with any activity.  Her pain is located over the sacrum and over the bilateral buttocks.  Initially she reports having had some nonspecific numbness/tingling in the right lower extremity which has since resolved.  She denies any weakness in the lower extremity.  No red flag symptoms reported.  She has been using 1/2 tablet of tramadol and ibuprofen as needed for pain which have helped.  She was also prescribed lidocaine patches at the emergency department.   10/28 - Pt returns today for followup for sacral pain secondary to fx. Pt continues to have 7-8/10 pain sacral area, sharp. Worse with activity and bending forward. She has been using 1/2 tablet of tramadol at night for pain as well as lidocaine patches which help.. During the day she uses ibuprofen. She has not been using a donut cushion. She denies any numbness or tingling into the LE. No weakness.  No bowel/bladder dysfunction.   Past Medical History:  Diagnosis Date  . Brain tumor (benign) ([place])   . Chronic right shoulder pain   . Diverticulitis   . Migraines   . Thyroid disease   . Vertigo     Current Outpatient Medications on File Prior to Visit  Medication Sig Dispense Refill  . amitriptyline (ELAVIL) 25 MG tablet Take 25 mg by mouth  at bedtime.  6  . HYDROcodone-acetaminophen (NORCO/VICODIN) 5-325 MG tablet Take by mouth.    Marland Kitchen ibuprofen (ADVIL,MOTRIN) 800 MG tablet Take 1 tablet (800 mg total) by mouth every 8 (eight) hours as needed. 90 tablet 1  . levothyroxine (SYNTHROID, LEVOTHROID) 100 MCG tablet Take 100 mcg by mouth daily before breakfast.    . lidocaine (LIDODERM) 5 % Apply patch to painful area. Patch may remain in place for up to 12 hours in a 24 hour period.    . methocarbamol (ROBAXIN) 500 MG tablet Take by mouth.    . montelukast (SINGULAIR) 10 MG tablet Take 10 mg by mouth at bedtime.    . ondansetron (ZOFRAN ODT) 4 MG disintegrating tablet Take 1 tablet (4 mg total) by mouth every 6 (six) hours as needed for nausea or vomiting. 20 tablet 0  . SUMAtriptan (IMITREX) 100 MG tablet TAKE ONE TABLET FOR MIGRAINE RELIEF. MAY REPEAT 2 HOURS LATER. MAXIMUM 2 PER DAY.     No current facility-administered medications on file prior to visit.     Past Surgical History:  Procedure Laterality Date  . BRAIN SURGERY    . COSMETIC SURGERY     Facial surgery due to dog bite  . ENDOMETRIAL ABLATION    . NASAL SINUS SURGERY      Allergies  Allergen Reactions  . Morphine Swelling    Social History   Socioeconomic History  .  Marital status: Divorced    Spouse name: Not on file  . Number of children: Not on file  . Years of education: Not on file  . Highest education level: Not on file  Occupational History  . Not on file  Social Needs  . Financial resource strain: Not on file  . Food insecurity:    Worry: Not on file    Inability: Not on file  . Transportation needs:    Medical: Not on file    Non-medical: Not on file  Tobacco Use  . Smoking status: Never Smoker  . Smokeless tobacco: Never Used  Substance and Sexual Activity  . Alcohol use: No  . Drug use: No  . Sexual activity: Yes    Birth control/protection: Surgical  Lifestyle  . Physical activity:    Days per week: Not on file    Minutes per  session: Not on file  . Stress: Not on file  Relationships  . Social connections:    Talks on phone: Not on file    Gets together: Not on file    Attends religious service: Not on file    Active member of club or organization: Not on file    Attends meetings of clubs or organizations: Not on file    Relationship status: Not on file  . Intimate partner violence:    Fear of current or ex partner: Not on file    Emotionally abused: Not on file    Physically abused: Not on file    Forced sexual activity: Not on file  Other Topics Concern  . Not on file  Social History Narrative  . Not on file    No family history on file.  BP 107/71   Pulse 73   Ht 5\' 5"  (1.651 m)   Wt 160 lb (72.6 kg)   BMI 26.63 kg/m   Review of Systems: See HPI above.     Objective:  Physical Exam:  GEN: awake, alert, NAD Pulm: breathing unlabored  Lumbosacral spine: - Inspection: no gross deformity or asymmetry, swelling or ecchymosis - Palpation: TTP over the sacrum midline. No spinous process TTP - ROM: full active ROM of the lumbar spine in flexion and extension. Pain with flexion - Strength: 5/5 strength of lower extremity in L4-S1 nerve root distributions b/l - Neuro: sensation intact in the L4-S1 nerve root distribution b/l - Special testing: Negative straight leg raise  Hips: no pain with passive IR/ER   Assessment & Plan:  1.  Sacral pain secondary to fx - pt doing well despite ongoing pain. No new concerning symptoms - will reorder tramadol 50mg  for severe pain - continue ibuprofen PRN - recommend OTC salonpas to continue topical lidocaine patch - donut cushion. - f/u 2 weeks

## 2017-12-19 NOTE — Patient Instructions (Addendum)
You have a sacral contusion vs hairline fracture of your sacrum. Tylenol 500mg  1-2 tabs three times a day for pain. Ibuprofen 600mg  three times a day with food for pain and inflammation. Tramadol as needed for pain - no driving on this. Salon pas patches topically. Consider a donut pad or innertube to sit on for comfort - call [facility] to see if they have one before ordering from [facility]. When you can, get the MRI of your cervical spine. Follow up with me in 2 weeks for reevaluation.

## 2017-12-28 ENCOUNTER — Encounter: Payer: Self-pay | Admitting: Family Medicine

## 2017-12-29 ENCOUNTER — Other Ambulatory Visit: Payer: Self-pay | Admitting: Family Medicine

## 2017-12-29 DIAGNOSIS — M5412 Radiculopathy, cervical region: Secondary | ICD-10-CM

## 2018-01-02 ENCOUNTER — Ambulatory Visit: Payer: 59 | Admitting: Family Medicine

## 2018-01-02 ENCOUNTER — Encounter: Payer: Self-pay | Admitting: Family Medicine

## 2018-01-02 VITALS — BP 119/81 | HR 70 | Ht 65.0 in | Wt 160.0 lb

## 2018-01-02 DIAGNOSIS — M533 Sacrococcygeal disorders, not elsewhere classified: Secondary | ICD-10-CM | POA: Diagnosis not present

## 2018-01-02 NOTE — Progress Notes (Signed)
PCP: Charline Bills, MD  Subjective:   HPI:  10/21 - Patient is a 47 y.o. female here for sacral pain.  Patient states that at work on 12/09/2017 she stepped backward, tripped, and fell onto her sacrum.  She remained at work for the rest of her shift which is about 2 hours.  She reports increased pain that night and the following day.  She finally went to the emergency department about 36 hours after the fall because the pain was intense enough that it made it difficult for her to sleep.  In the emergency department, she had imaging of her head, cervical spine, lumbar spine and sacrum.  She was diagnosed with a sacral fracture.  Today, she reports continued 10/10 constant sharp pain that is worse with any activity.  Her pain is located over the sacrum and over the bilateral buttocks.  Initially she reports having had some nonspecific numbness/tingling in the right lower extremity which has since resolved.  She denies any weakness in the lower extremity.  No red flag symptoms reported.  She has been using 1/2 tablet of tramadol and ibuprofen as needed for pain which have helped.  She was also prescribed lidocaine patches at the emergency department.   10/28 - Pt returns today for followup for sacral pain secondary to fx. Pt continues to have 7-8/10 pain sacral area, sharp. Worse with activity and bending forward. She has been using 1/2 tablet of tramadol at night for pain as well as lidocaine patches which help.. During the day she uses ibuprofen. She has not been using a donut cushion. She denies any numbness or tingling into the LE. No weakness.  No bowel/bladder dysfunction.  11/11: Patient reports she's improved compared to last visit. Pain level down to 6/10 level, more of a discomfort. No radiation into lower extremities. Taking ibuprofen, tylenol, salon pas patches. Taking tramadol 1/2 tab at nighttime. Donut pad was helping but dog bit into this so has to get another one. No skin  changes, numbness. No bowel/bladder dysfunction.  Past Medical History:  Diagnosis Date  . Brain tumor (benign) ([place])   . Chronic right shoulder pain   . Diverticulitis   . Migraines   . Thyroid disease   . Vertigo     Current Outpatient Medications on File Prior to Visit  Medication Sig Dispense Refill  . ibuprofen (ADVIL,MOTRIN) 800 MG tablet Take 1 tablet (800 mg total) by mouth every 8 (eight) hours as needed. 90 tablet 1  . levothyroxine (SYNTHROID, LEVOTHROID) 100 MCG tablet Take 100 mcg by mouth daily before breakfast.    . lidocaine (LIDODERM) 5 % Apply patch to painful area. Patch may remain in place for up to 12 hours in a 24 hour period.    . montelukast (SINGULAIR) 10 MG tablet Take 10 mg by mouth at bedtime.    . ondansetron (ZOFRAN ODT) 4 MG disintegrating tablet Take 1 tablet (4 mg total) by mouth every 6 (six) hours as needed for nausea or vomiting. 20 tablet 0  . SUMAtriptan (IMITREX) 100 MG tablet TAKE ONE TABLET FOR MIGRAINE RELIEF. MAY REPEAT 2 HOURS LATER. MAXIMUM 2 PER DAY.    . traMADol (ULTRAM) 50 MG tablet Take 1 tablet (50 mg total) by mouth every 6 (six) hours as needed. 20 tablet 0   No current facility-administered medications on file prior to visit.     Past Surgical History:  Procedure Laterality Date  . BRAIN SURGERY    . COSMETIC SURGERY  Facial surgery due to dog bite  . ENDOMETRIAL ABLATION    . NASAL SINUS SURGERY      Allergies  Allergen Reactions  . Morphine Swelling    Social History   Socioeconomic History  . Marital status: Divorced    Spouse name: Not on file  . Number of children: Not on file  . Years of education: Not on file  . Highest education level: Not on file  Occupational History  . Not on file  Social Needs  . Financial resource strain: Not on file  . Food insecurity:    Worry: Not on file    Inability: Not on file  . Transportation needs:    Medical: Not on file    Non-medical: Not on file  Tobacco Use   . Smoking status: Never Smoker  . Smokeless tobacco: Never Used  Substance and Sexual Activity  . Alcohol use: No  . Drug use: No  . Sexual activity: Yes    Birth control/protection: Surgical  Lifestyle  . Physical activity:    Days per week: Not on file    Minutes per session: Not on file  . Stress: Not on file  Relationships  . Social connections:    Talks on phone: Not on file    Gets together: Not on file    Attends religious service: Not on file    Active member of club or organization: Not on file    Attends meetings of clubs or organizations: Not on file    Relationship status: Not on file  . Intimate partner violence:    Fear of current or ex partner: Not on file    Emotionally abused: Not on file    Physically abused: Not on file    Forced sexual activity: Not on file  Other Topics Concern  . Not on file  Social History Narrative  . Not on file    History reviewed. No pertinent family history.  BP 119/81   Pulse 70   Ht 5\' 5"  (1.651 m)   Wt 160 lb (72.6 kg)   BMI 26.63 kg/m   Review of Systems: See HPI above.     Objective:  Physical Exam:  Gen: NAD, comfortable in exam room  Back: No gross deformity, scoliosis. TTP sacrum and SI joints bilaterally.  No other tenderness. FROM with pain on flexion. Strength LEs 5/5 all muscle groups.   2+ MSRs in patellar and achilles tendons, equal bilaterally. Negative SLRs. Sensation intact to light touch bilaterally. Negative logroll bilateral hips   Assessment & Plan:  1.  Sacral pain secondary to fx - Patient now 3 1/2 weeks out.  Clinically improving.  Shown home exercises/stretches to do daily.  Tylenol, ibuprofen, salon pas with tramadol as needed.  Get new donut pad.  F/u in 2 weeks.

## 2018-01-02 NOTE — Patient Instructions (Signed)
You have a sacral contusion vs hairline fracture of your sacrum. Tylenol 500mg  1-2 tabs three times a day for pain. Ibuprofen 600mg  three times a day with food for pain and inflammation. Tramadol as needed for pain - no driving on this. Salon pas patches topically. Start home exercises daily. Get a new donut pad and keep it away from your dog haha Follow up with me in 2 weeks for reevaluation.

## 2018-01-10 ENCOUNTER — Ambulatory Visit
Admission: RE | Admit: 2018-01-10 | Discharge: 2018-01-10 | Disposition: A | Discharge status: Home / Self Care | Payer: 59 | Source: Ambulatory Visit | Attending: Family Medicine | Admitting: Family Medicine

## 2018-01-10 DIAGNOSIS — M5412 Radiculopathy, cervical region: Secondary | ICD-10-CM

## 2018-01-10 IMAGING — XA DG INJECT/[PERSON_NAME] INC NEEDLE/CATH/PLC EPI/CERV/THOR W/IMG
2 series · 2 of 2 positions shown · non-contrast
Comparison: none

CLINICAL DATA: Cervical spondylosis without myelopathy. RIGHT arm
radicular symptoms. Symptomatic C5-6 foraminal narrowing.

[Series 2: ortho standard · 1 of 1 slices shown (1 of 2)]
[im 1/1]
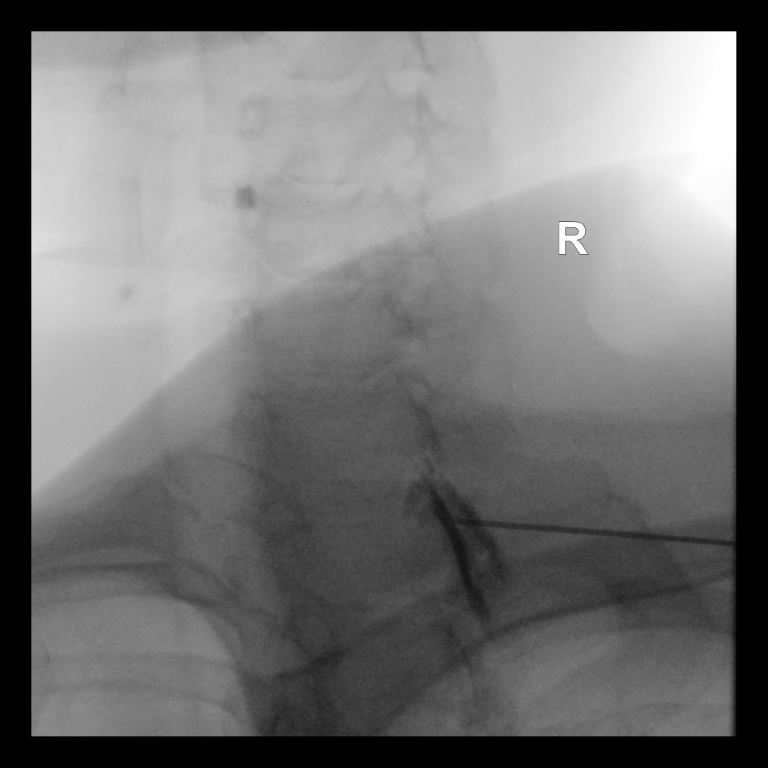

[Series 3: ortho standard · 1 of 1 slices shown (2 of 2)]
[im 1/1]
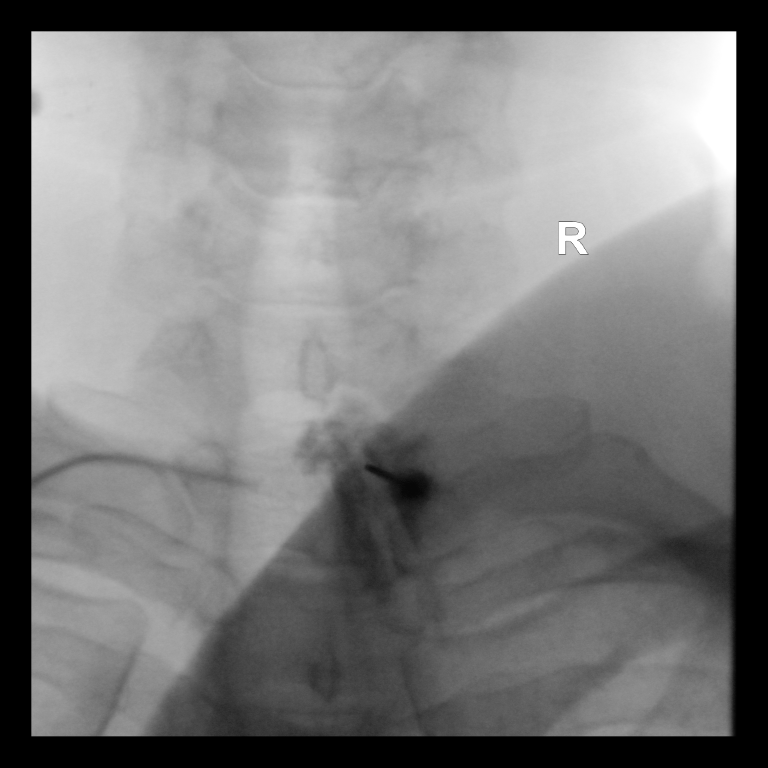

[2 of 2 positions shown; findings below may reference images not displayed]

FLUOROSCOPY TIME:  23 seconds corresponding to a Dose Area Product
of 25.5 Gy*m2

PROCEDURE:
Informed written consent was obtained.  Time-out was performed.

An appropriate skin entry site was chosen, cleansed with Betadine,
and anesthetized with 1% lidocaine.

CERVICAL EPIDURAL INJECTION

An interlaminar approach was performed on the RIGHT at C7-T1. A 20
gauge epidural needle was advanced using loss-of-resistance
technique.

DIAGNOSTIC EPIDURAL INJECTION

Injection of Isovue-M 300 shows a good epidural pattern with spread
above and below the level of needle placement, primarily on the
RIGHT. No vascular opacification is seen.

THERAPEUTIC  EPIDURAL INJECTION

1.5 ml of Kenalog 40 mixed with 1 ml of 1% Lidocaine and 2 ml of
normal saline were then instilled. The procedure was well-tolerated,
and the patient was discharged thirty minutes following the
injection in good condition.
IMPRESSION: Technically successful first epidural injection on the RIGHT at
C7-T1.

## 2018-01-10 MED ORDER — TRIAMCINOLONE ACETONIDE 40 MG/ML IJ SUSP (RADIOLOGY): 60.0000 mg | Freq: Once | INTRAMUSCULAR | Status: DC

## 2018-01-10 MED ORDER — IOPAMIDOL (ISOVUE-M 300) INJECTION 61%: 1.0000 mL | Freq: Once | INTRAMUSCULAR | Status: DC | PRN

## 2018-01-10 NOTE — Discharge Instructions (Signed)

## 2018-01-16 ENCOUNTER — Ambulatory Visit: Payer: 59 | Admitting: Family Medicine

## 2018-01-16 ENCOUNTER — Encounter: Payer: Self-pay | Admitting: Family Medicine

## 2018-01-16 VITALS — BP 110/72 | HR 85 | Ht 65.0 in | Wt 150.0 lb

## 2018-01-16 DIAGNOSIS — M533 Sacrococcygeal disorders, not elsewhere classified: Secondary | ICD-10-CM

## 2018-01-16 DIAGNOSIS — M542 Cervicalgia: Secondary | ICD-10-CM

## 2018-01-16 NOTE — Patient Instructions (Signed)
You're doing great! Call me if you start to notice pain worsening in neck and/or right arm and want to go ahead with another injection. Physical therapy is a consideration as well - if you're needing another injection we would likely couple that with physical therapy. Follow up with me in 6 weeks.

## 2018-01-16 NOTE — Progress Notes (Signed)
PCP: Charline Bills, MD  Subjective:   HPI:  10/21 - Patient is a 47 y.o. female here for sacral pain.  Patient states that at work on 12/09/2017 she stepped backward, tripped, and fell onto her sacrum.  She remained at work for the rest of her shift which is about 2 hours.  She reports increased pain that night and the following day.  She finally went to the emergency department about 36 hours after the fall because the pain was intense enough that it made it difficult for her to sleep.  In the emergency department, she had imaging of her head, cervical spine, lumbar spine and sacrum.  She was diagnosed with a sacral fracture.  Today, she reports continued 10/10 constant sharp pain that is worse with any activity.  Her pain is located over the sacrum and over the bilateral buttocks.  Initially she reports having had some nonspecific numbness/tingling in the right lower extremity which has since resolved.  She denies any weakness in the lower extremity.  No red flag symptoms reported.  She has been using 1/2 tablet of tramadol and ibuprofen as needed for pain which have helped.  She was also prescribed lidocaine patches at the emergency department.   10/28 - Pt returns today for followup for sacral pain secondary to fx. Pt continues to have 7-8/10 pain sacral area, sharp. Worse with activity and bending forward. She has been using 1/2 tablet of tramadol at night for pain as well as lidocaine patches which help.. During the day she uses ibuprofen. She has not been using a donut cushion. She denies any numbness or tingling into the LE. No weakness.  No bowel/bladder dysfunction.  11/11: Patient reports she's improved compared to last visit. Pain level down to 6/10 level, more of a discomfort. No radiation into lower extremities. Taking ibuprofen, tylenol, salon pas patches. Taking tramadol 1/2 tab at nighttime. Donut pad was helping but dog bit into this so has to get another one. No skin  changes, numbness. No bowel/bladder dysfunction.  11/25: Patient reports she's doing much better. Pain in low back and sacral area currently 0/10. Some stinging in low back but not bothering currently. Can feel this when stretching. No bowel/bladder dysfunction. ESI for cervical radiculopathy has helped a lot as well  Past Medical History:  Diagnosis Date  . Brain tumor (benign) ([place])   . Chronic right shoulder pain   . Diverticulitis   . Migraines   . Thyroid disease   . Vertigo     Current Outpatient Medications on File Prior to Visit  Medication Sig Dispense Refill  . ibuprofen (ADVIL,MOTRIN) 800 MG tablet Take 1 tablet (800 mg total) by mouth every 8 (eight) hours as needed. 90 tablet 1  . levothyroxine (SYNTHROID, LEVOTHROID) 100 MCG tablet Take 100 mcg by mouth daily before breakfast.    . montelukast (SINGULAIR) 10 MG tablet Take 10 mg by mouth at bedtime.    . ondansetron (ZOFRAN ODT) 4 MG disintegrating tablet Take 1 tablet (4 mg total) by mouth every 6 (six) hours as needed for nausea or vomiting. 20 tablet 0  . SUMAtriptan (IMITREX) 100 MG tablet TAKE ONE TABLET FOR MIGRAINE RELIEF. MAY REPEAT 2 HOURS LATER. MAXIMUM 2 PER DAY.    . traMADol (ULTRAM) 50 MG tablet Take 1 tablet (50 mg total) by mouth every 6 (six) hours as needed. 20 tablet 0   No current facility-administered medications on file prior to visit.     Past Surgical History:  Procedure Laterality Date  . BRAIN SURGERY    . COSMETIC SURGERY     Facial surgery due to dog bite  . ENDOMETRIAL ABLATION    . NASAL SINUS SURGERY      Allergies  Allergen Reactions  . Morphine Swelling    Social History   Socioeconomic History  . Marital status: Divorced    Spouse name: Not on file  . Number of children: Not on file  . Years of education: Not on file  . Highest education level: Not on file  Occupational History  . Not on file  Social Needs  . Financial resource strain: Not on file  . Food  insecurity:    Worry: Not on file    Inability: Not on file  . Transportation needs:    Medical: Not on file    Non-medical: Not on file  Tobacco Use  . Smoking status: Never Smoker  . Smokeless tobacco: Never Used  Substance and Sexual Activity  . Alcohol use: No  . Drug use: No  . Sexual activity: Yes    Birth control/protection: Surgical  Lifestyle  . Physical activity:    Days per week: Not on file    Minutes per session: Not on file  . Stress: Not on file  Relationships  . Social connections:    Talks on phone: Not on file    Gets together: Not on file    Attends religious service: Not on file    Active member of club or organization: Not on file    Attends meetings of clubs or organizations: Not on file    Relationship status: Not on file  . Intimate partner violence:    Fear of current or ex partner: Not on file    Emotionally abused: Not on file    Physically abused: Not on file    Forced sexual activity: Not on file  Other Topics Concern  . Not on file  Social History Narrative  . Not on file    History reviewed. No pertinent family history.  BP 110/72   Pulse 85   Ht 5\' 5"  (1.651 m)   Wt 150 lb (68 kg)   BMI 24.96 kg/m   Review of Systems: See HPI above.     Objective:  Physical Exam:  Gen: NAD, comfortable in exam room  Back: No gross deformity, scoliosis. TTP mildly sacrum and SI joints bilaterally. FROM without pain. Strength LEs 5/5 all muscle groups.   2+ MSRs in patellar and achilles tendons, equal bilaterally. Negative SLRs. Sensation intact to light touch bilaterally. Negative logroll bilateral hips   Assessment & Plan:  1.  Sacral pain secondary to fx - patient about 5.5 weeks out, much improved clinically.  Will write her out of work for another week then to return with restrictions noted in letter more related to her cervical radiculopathy.  Consider repeat injection, physical therapy if not improving.  F/u in 6 weeks.

## 2018-01-24 ENCOUNTER — Telehealth: Payer: Self-pay | Admitting: Family Medicine

## 2018-01-24 NOTE — Telephone Encounter (Signed)
Robaxin is listed in her chart - does she tolerate this ok without making her too sleepy?

## 2018-01-24 NOTE — Telephone Encounter (Signed)
Patient requesting a Rx of something for muscle spasms. Something mild that she can take while on the job.  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 She said yesterday and this morning she has felt irritation and tingling from her neck into her shoulder  Patient is still taking ibuprofen occasionally, but not as much due to it irritating her stomach.   Preferred Pharmacy: [facility]

## 2018-01-25 MED ORDER — METHOCARBAMOL 500 MG PO TABS: 500.0000 mg | ORAL_TABLET | Freq: Three times a day (TID) | ORAL | 1 refills | Status: DC | PRN

## 2018-01-25 NOTE — Telephone Encounter (Signed)
Sent in for her - thanks!

## 2018-01-25 NOTE — Telephone Encounter (Signed)
Patient is ok with Robaxin

## 2018-02-22 HISTORY — PX: CERVICAL SPINE SURGERY: SHX589

## 2018-02-27 ENCOUNTER — Ambulatory Visit: Payer: 59 | Admitting: Family Medicine

## 2018-03-01 ENCOUNTER — Encounter: Payer: Self-pay | Admitting: Family Medicine

## 2018-03-01 ENCOUNTER — Ambulatory Visit: Payer: 59 | Admitting: Family Medicine

## 2018-03-01 VITALS — BP 118/61 | HR 76 | Ht 65.0 in | Wt 151.0 lb

## 2018-03-01 DIAGNOSIS — M542 Cervicalgia: Secondary | ICD-10-CM | POA: Diagnosis not present

## 2018-03-01 NOTE — Patient Instructions (Signed)
Good luck with your surgery on the 20th! Let me know how you're doing after this - I'd recommend repeating the shot for your neck and coupling this with physical therapy when you feel well enough to do these - just call me so we can set that up afterwards. Follow up and next steps will depend on how you respond to those. Typically I would see you back about 4 weeks after starting therapy.

## 2018-03-03 ENCOUNTER — Encounter: Payer: Self-pay | Admitting: Family Medicine

## 2018-03-03 NOTE — Progress Notes (Signed)
PCP: Charline Bills, MD  Subjective:   HPI: Patient is a 48 y.o. female here for neck/right shoulder injury.  2/21: Patient reports she's had problems with her right shoulder previously but 2 days ago she was pushing a heavy cart in a freezer at work and felt a sharp pop in posterior right shoulder radiating up to neck. Associated tingling, numbness in this area. Neck feels very stiff, 7/10 level of pain. Arm has improved but neck still a problem. Pain worse with neck motions but also with trying to reach overhead. No skin changes.  3/4: Patient returns with continued severe pain in right side of neck, posterior right shoulder. Pain level 7/10, sharp. Worse with turning head. Having weakness - almost dropped granddaughter extending her out with her arms. Finished prednisone and taking tizanidine. No bowel/bladder dysfunction.  3/19: Patient returns with persistent severe right sided neck, posterior shoulder pain. Pain is 8/10 level, sharp and throbbing. Done with prednisone and now taking naproxen, tizanidine with tramadol as needed. Cannot pick up a gallon of milk without pain and feeling like she's going to drop this. Pain also noted to be worse when sneezing. No bowel or bladder dysfunction.  10/17: Patient returns with continued problems right side of neck down right arm. Pain level 9/10, sharp. Motion of neck limited. Difficulty picking items up still. She never improved after last visit but has finally been able to get health insurance. Associated tingling and numbness down arm into all digits.  03/01/18: Patient reports her low back is improved but pain in right side of neck down her arm is up to 7/10 and sharp again. States the shot did help but only temporarily. She reports decreased sensation in right hand compared to left. Motion of neck more limited. She's having surgery for ovarian cyst on 1/20. No skin changes.  Past Medical History:  Diagnosis Date  .  Brain tumor (benign) ([place])   . Chronic right shoulder pain   . Diverticulitis   . Migraines   . Thyroid disease   . Vertigo     Current Outpatient Medications on File Prior to Visit  Medication Sig Dispense Refill  . ibuprofen (ADVIL,MOTRIN) 800 MG tablet Take 1 tablet (800 mg total) by mouth every 8 (eight) hours as needed. 90 tablet 1  . levothyroxine (SYNTHROID, LEVOTHROID) 100 MCG tablet Take 100 mcg by mouth daily before breakfast.    . methocarbamol (ROBAXIN) 500 MG tablet Take 1 tablet (500 mg total) by mouth every 8 (eight) hours as needed. 60 tablet 1  . montelukast (SINGULAIR) 10 MG tablet Take 10 mg by mouth at bedtime.    . ondansetron (ZOFRAN ODT) 4 MG disintegrating tablet Take 1 tablet (4 mg total) by mouth every 6 (six) hours as needed for nausea or vomiting. 20 tablet 0  . SUMAtriptan (IMITREX) 100 MG tablet TAKE ONE TABLET FOR MIGRAINE RELIEF. MAY REPEAT 2 HOURS LATER. MAXIMUM 2 PER DAY.    . traMADol (ULTRAM) 50 MG tablet Take 1 tablet (50 mg total) by mouth every 6 (six) hours as needed. 20 tablet 0   No current facility-administered medications on file prior to visit.     Past Surgical History:  Procedure Laterality Date  . BRAIN SURGERY    . COSMETIC SURGERY     Facial surgery due to dog bite  . ENDOMETRIAL ABLATION    . NASAL SINUS SURGERY      Allergies  Allergen Reactions  . Morphine Swelling    Social History  Socioeconomic History  . Marital status: Divorced    Spouse name: Not on file  . Number of children: Not on file  . Years of education: Not on file  . Highest education level: Not on file  Occupational History  . Not on file  Social Needs  . Financial resource strain: Not on file  . Food insecurity:    Worry: Not on file    Inability: Not on file  . Transportation needs:    Medical: Not on file    Non-medical: Not on file  Tobacco Use  . Smoking status: Never Smoker  . Smokeless tobacco: Never Used  Substance and Sexual Activity   . Alcohol use: No  . Drug use: No  . Sexual activity: Yes    Birth control/protection: Surgical  Lifestyle  . Physical activity:    Days per week: Not on file    Minutes per session: Not on file  . Stress: Not on file  Relationships  . Social connections:    Talks on phone: Not on file    Gets together: Not on file    Attends religious service: Not on file    Active member of club or organization: Not on file    Attends meetings of clubs or organizations: Not on file    Relationship status: Not on file  . Intimate partner violence:    Fear of current or ex partner: Not on file    Emotionally abused: Not on file    Physically abused: Not on file    Forced sexual activity: Not on file  Other Topics Concern  . Not on file  Social History Narrative  . Not on file    History reviewed. No pertinent family history.  BP 118/61   Pulse 76   Ht 5\' 5"  (1.651 m)   Wt 151 lb (68.5 kg)   BMI 25.13 kg/m   Review of Systems: See HPI above.     Objective:  Physical Exam:  Gen: NAD, comfortable in exam room  Neck: No gross deformity, swelling, bruising. TTP right cervical paraspinal region and trapezius.  No midline/bony TTP. ROM limited to 10 degrees extension, 15 flexion, 20 lateral rotations. BUE strength 5/5.   Sensation diminished right hand compared to left.   2+ radial pulses.  Assessment & Plan:  1. Neck/right shoulder pain - 2/2 cervical radiculopathy with disc osteophyte complexes at multiple levels C4-5 through C6-7 but worse is at C5-6 with severe right foraminal stenosis and moderate spinal stenosis.  She is scheduled to have surgery for an ovarian cyst on January 20.  She is interested in repeating epidural steroid injection for her neck and coupling this with physical therapy but is going to wait until she is recovered enough from her abdominal surgery to go ahead with this.  She states she will call us after this.  Follow-up after this and next steps will depend  on how she responds to injection and physical therapy.  Advised him plan to see her back about 4 weeks after starting therapy.

## 2018-04-04 ENCOUNTER — Telehealth: Payer: Self-pay | Admitting: Family Medicine

## 2018-04-04 NOTE — Telephone Encounter (Signed)
Patient states that her surgery went well and she has been cleared by Dr. Grant Fontana. She would like to go ahead with the injection in her neck and physical therapy.   Patient asked if she should start physical therapy before or after injection and how many visits you suggest.

## 2018-04-05 NOTE — Telephone Encounter (Signed)
Ok to go ahead with injection - same as last one.  She should start therapy about 5-7 days after her injection.  Number of visits depends on how she improves.

## 2018-04-06 ENCOUNTER — Other Ambulatory Visit: Payer: Self-pay | Admitting: Family Medicine

## 2018-04-06 DIAGNOSIS — M5412 Radiculopathy, cervical region: Secondary | ICD-10-CM

## 2018-04-06 NOTE — Addendum Note (Signed)
Addended by: Sherrie George F on: 04/06/2018 08:15 AM   Modules accepted: Orders

## 2018-04-06 NOTE — Telephone Encounter (Signed)
Orders for injection and PT have been placed.

## 2018-04-19 ENCOUNTER — Ambulatory Visit
Admission: RE | Admit: 2018-04-19 | Discharge: 2018-04-19 | Disposition: A | Discharge status: Home / Self Care | Payer: 59 | Source: Ambulatory Visit | Attending: Family Medicine | Admitting: Family Medicine

## 2018-04-19 DIAGNOSIS — M5412 Radiculopathy, cervical region: Secondary | ICD-10-CM

## 2018-04-19 IMAGING — XA DG INJECT/[PERSON_NAME] INC NEEDLE/CATH/PLC EPI/CERV/THOR W/IMG
2 series · 2 of 2 positions shown · non-contrast
Comparison: none

CLINICAL DATA: Neck pain with radiation to the right shoulder and
arm.

[Series 1: ortho standard · 1 of 1 slices shown (1 of 2)]
[im 1/1]
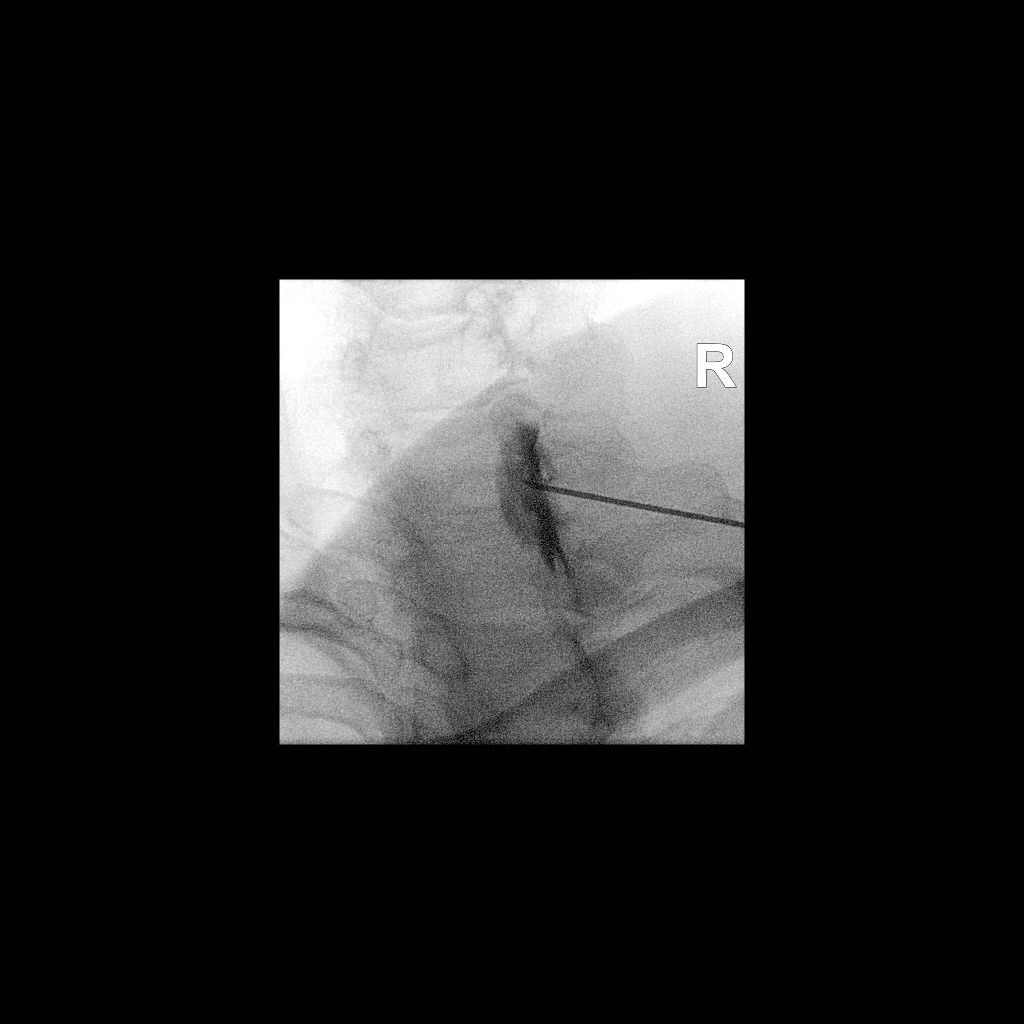

[Series 2: ortho standard · 1 of 1 slices shown (2 of 2)]
[im 1/1]
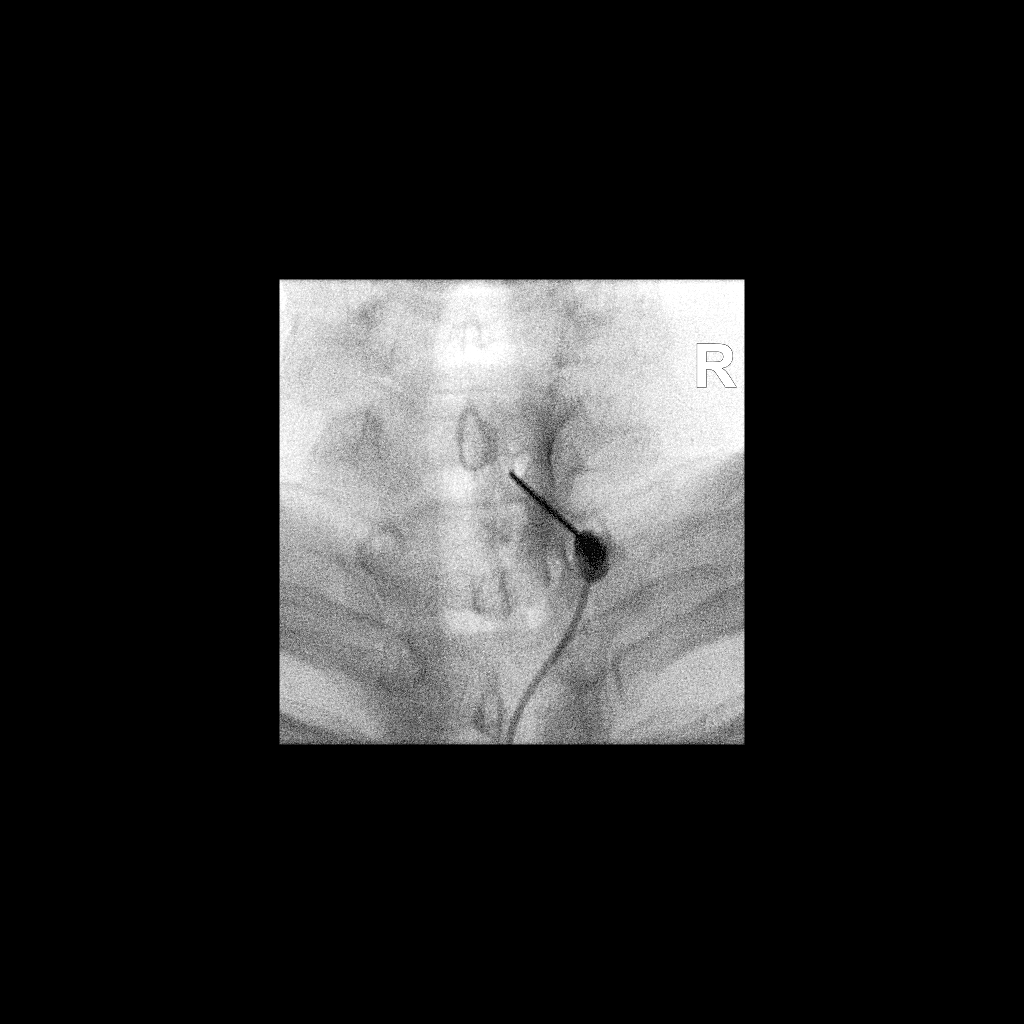

[2 of 2 positions shown; findings below may reference images not displayed]

FLUOROSCOPY TIME:  0 minutes 26 seconds. 6.47 micro gray meter
squared

PROCEDURE:
CERVICAL EPIDURAL INJECTION

An interlaminar approach was performed on the right at C7. A 20
gauge epidural needle was advanced using loss-of-resistance
technique.

DIAGNOSTIC EPIDURAL INJECTION

Injection of Isovue-M 300 shows a good epidural pattern with spread
above and below the level of needle placement, primarily on the
right. No vascular opacification is seen. THERAPEUTIC

EPIDURAL INJECTION

1.5 ml of Kenalog 40 mixed with 1 ml of 1% Lidocaine and 2 ml of
normal saline were then instilled. The procedure was well-tolerated,
and the patient was discharged thirty minutes following the
injection in good condition.
IMPRESSION: Technically successful second epidural injection on the right at C7.

## 2018-04-19 MED ORDER — IOHEXOL 300 MG/ML  SOLN
1.0000 mL | Freq: Once | INTRAMUSCULAR | Status: AC | PRN
  Administered 2018-04-19: 1 mL via EPIDURAL

## 2018-04-19 MED ORDER — TRIAMCINOLONE ACETONIDE 40 MG/ML IJ SUSP (RADIOLOGY)
60.0000 mg | Freq: Once | INTRAMUSCULAR | Status: AC
  Administered 2018-04-19: 60 mg via EPIDURAL

## 2018-04-19 NOTE — Discharge Instructions (Signed)

## 2018-04-26 ENCOUNTER — Other Ambulatory Visit: Payer: Self-pay

## 2018-04-26 ENCOUNTER — Encounter: Payer: Self-pay | Admitting: Physical Therapy

## 2018-04-26 ENCOUNTER — Ambulatory Visit: Payer: 59 | Attending: Family Medicine | Admitting: Physical Therapy

## 2018-04-26 DIAGNOSIS — R293 Abnormal posture: Secondary | ICD-10-CM | POA: Diagnosis present

## 2018-04-26 DIAGNOSIS — M542 Cervicalgia: Secondary | ICD-10-CM | POA: Diagnosis present

## 2018-04-26 DIAGNOSIS — M6281 Muscle weakness (generalized): Secondary | ICD-10-CM

## 2018-04-26 DIAGNOSIS — R29898 Other symptoms and signs involving the musculoskeletal system: Secondary | ICD-10-CM

## 2018-04-26 NOTE — Therapy (Signed)
[facility] [address]  [place], [place], [address] Phone: [phone]   Fax:  [phone]  Physical Therapy Evaluation  Patient Details  Name: Kristine Zimmerman MRN: [id number] Date of Birth: 1970/10/21 Referring Provider (PT): Karlton Lemon, MD   Encounter Date: 04/26/2018  PT End of Session - 04/26/18 1757    Visit Number  1    Number of Visits  13    Date for PT Re-Evaluation  06/07/18    Authorization Type  UHC; VL 23    PT Start Time  2585    PT Stop Time  1753    PT Time Calculation (min)  60 min    Activity Tolerance  Patient tolerated treatment well;Patient limited by pain    Behavior During Therapy  [facility] for tasks assessed/performed       Past Medical History:  Diagnosis Date  . Brain tumor (benign) ([place])   . Chronic right shoulder pain   . Diverticulitis   . Migraines   . Thyroid disease   . Vertigo     Past Surgical History:  Procedure Laterality Date  . BRAIN SURGERY    . COSMETIC SURGERY     Facial surgery due to dog bite  . ENDOMETRIAL ABLATION    . NASAL SINUS SURGERY      There were no vitals filed for this visit.   Subjective Assessment - 04/26/18 1704    Subjective  Patient reports chronic neck pain of 2 years duration. Has been getting injections to the center of her neck with previous symptoms on R side and radiating to R arm to fingers, more recently symptoms have started to move to L side of neck and arm. Has trouble with positioning with sleeping, lifting, vacuuming, pushing furniture, rotating neck, weed eating, putting on bra. Placed on 15# lifting restriction. Wakes up with sharp pain in posterior neck or N/T in R arm.    Pertinent History  benign brain tumor, vertigo, thyroid disease, migraine, chronic R shoulder pain    Limitations  House hold activities;Writing;Lifting    Diagnostic tests  per- MD "disc osteophyte complexes at multiple levels C4-5 through C6-7 but worse is  at C5-6 with severe right foraminal stenosis and moderate spinal stenosis"    Patient Stated Goals  get rid of pain    Currently in Pain?  Yes    Pain Score  8     Pain Location  Neck    Pain Orientation  Left;Posterior;Lateral    Pain Descriptors / Indicators  Tightness    Pain Type  Chronic pain         [facility] PT Assessment - 04/26/18 1715      Assessment   Medical Diagnosis  Neck pain on R side    Referring Provider (PT)  Karlton Lemon, MD    Onset Date/Surgical Date  04/25/16    Hand Dominance  Right    Next MD Visit  not scheduled    Prior Therapy  no      Precautions   Precautions  None   vertigo, sacral fx; 15# lifting restriction     Restrictions   Weight Bearing Restrictions  No      Balance Screen   Has the patient fallen in the past 6 months  No    Has the patient had a decrease in activity level because of a fear of falling?   No    Is the patient reluctant to  leave their home because of a fear of falling?   No      Home Film/video editor residence      Prior Function   Level of Independence  Independent    Vocation  Full time employment    Vocation Requirements  lifing, standing for prolonged periods, pulling, pushing    Leisure  kayaking       Cognition   Overall Cognitive Status  Within Functional Limits for tasks assessed      Observation/Other Assessments   Focus on Therapeutic Outcomes (FOTO)   Neck: 48 (52% limited, 43% predicted)      Sensation   Light Touch  Appears Intact   slightly decreased sensation on R hand and forearm     Coordination   Gross Motor Movements are Fluid and Coordinated  Yes      Posture/Postural Control   Posture/Postural Control  Postural limitations    Postural Limitations  Rounded Shoulders;Forward head   apparent dowager's hump     ROM / Zimmerman   AROM / PROM / Zimmerman  AROM;Zimmerman      AROM   AROM Assessment Site  Cervical    Cervical Flexion  40   mild pain   Cervical  Extension  25   reliance on capital flexion   Cervical - Right Side Bend  21   severe pain in UT   Cervical - Left Side Bend  26   severe pain in UT   Cervical - Right Rotation  severely limited   severe pain   Cervical - Left Rotation  moderately limited   severe pain     Zimmerman   Zimmerman Assessment Site  Shoulder;Elbow;Wrist;Hand    Right/Left Shoulder  Right;Left    Right Shoulder Flexion  4/5   mild R UT pain   Right Shoulder ABduction  4/5   moderate pain   Right Shoulder Internal Rotation  4-/5   moderate pain   Right Shoulder External Rotation  4-/5   moderate pain   Left Shoulder Flexion  4/5    Left Shoulder ABduction  4/5   moderate pain   Left Shoulder Internal Rotation  4-/5   moderate pain   Left Shoulder External Rotation  4-/5   moderate pain   Right/Left Elbow  Right;Left    Right Elbow Flexion  4+/5    Right Elbow Extension  3+/5    Left Elbow Flexion  4+/5    Left Elbow Extension  3+/5    Right/Left Wrist  Right;Left    Right Wrist Flexion  4+/5    Right Wrist Extension  4+/5    Left Wrist Flexion  4+/5    Left Wrist Extension  4+/5    Right/Left hand  Right;Left    Right Hand Grip (lbs)  19    Left Hand Grip (lbs)  10      Palpation   Spinal mobility  diffusely tender throughout entire cervical and upper thoracic spine   reporting relief with pressure   Palpation comment  diffuse increase in muscle tension in B UT, LS, scalenes, suboccipitals, pecs- most tension and tenderness in UT      Special Tests    Special Tests  Cervical    Cervical Tests  Dictraction      Distraction Test   Findngs  Positive    Comment  positive for relief of pain  Objective measurements completed on examination: See above findings.              PT Education - 04/26/18 1756    Education Details  prognosis, POC, HEP; advised patient not to push into pain with HEP and perform HEP in high-backed chair if dizziness occurs     Person(s) Educated  Patient    Methods  Explanation;Demonstration;Tactile cues;Verbal cues;Handout    Comprehension  Verbalized understanding;Returned demonstration       PT Short Term Goals - 04/26/18 1806      PT SHORT TERM GOAL #1   Title  Patient to be independent with initial HEP.    Time  3    Period  Weeks    Status  New    Target Date  05/17/18        PT Long Term Goals - 04/26/18 1809      PT LONG TERM GOAL #1   Title  Patient to be independent with advanced HEP.    Time  6    Period  Weeks    Status  New    Target Date  06/07/18      PT LONG TERM GOAL #2   Title  Patient to demonstrate [facility] and with <3/10 pain with cervical AROM.    Time  6    Period  Weeks    Status  New    Target Date  06/07/18      PT LONG TERM GOAL #3   Title  Patient to demonstrate >=4+/5 Zimmerman in B UEs.    Time  6    Period  Weeks    Status  New    Target Date  06/07/18      PT LONG TERM GOAL #4   Title  Patient to report 50% improvement in sleeping tolerance.     Time  6    Period  Weeks    Status  New    Target Date  06/07/18      PT LONG TERM GOAL #5   Title  Patient to report no difficulty with checking blind spot while driving.     Time  6    Period  Weeks    Status  New    Target Date  06/07/18             Plan - 04/26/18 1757    Clinical Impression Statement  Patient is a 47y/o F presenting to OPPT with c/o chronic B neck pain radiating along B arms and hands. Has intermittent improvement from injections, however stills struggling with N/T in B hands, trouble with positioning with sleeping, lifting, vacuuming, pushing furniture, rotating neck, weed eating, putting on bra. Patient currently on 15lb lifting restriction. Patient today with severely painful and limited cervical AROM, decreased B UE Zimmerman, decreased grip Zimmerman- L>R, abnormal posture, and diffuse tenderness and soft tissue restriction along posterior cervical and shoulder musculature. Patient  also reporting slight dizziness after palpation of soft tissues and cervical AROM. Patient educated on gentle stretching and ROM HEP- advised not to push into pain. Patient reported understanding. Would benefit from skilled PT services 2x/week for 6 weeks to address aforementioned impairments.     Personal Factors and Comorbidities  Past/Current Experience;Profession;Comorbidity 3+;Time since onset of injury/illness/exacerbation    Comorbidities  benign brain tumor, vertigo, thyroid disease, migraine, chronic R shoulder pain, sacral fx Oct 2019    Examination-Activity Limitations  Bathing;Reach Overhead;Sleep;Carry;Dressing;Hygiene/Grooming;Lift    Examination-Participation Restrictions  Personal Finances;Cleaning;Community Activity;Shop;Driving;Interpersonal  Relationship;Yard Work;Laundry;Meal Prep    Stability/Clinical Decision Making  Unstable/Unpredictable    Clinical Decision Making  High    Rehab Potential  Good    PT Frequency  2x / week    PT Duration  6 weeks    PT Treatment/Interventions  ADLs/Self Care Home Management;Cryotherapy;Electrical Stimulation;Functional mobility training;Ultrasound;Traction;Moist Heat;Therapeutic activities;Therapeutic exercise;Neuromuscular re-education;Patient/family education;Passive range of motion;Manual techniques;Dry needling;Energy conservation;Splinting;Taping;Vasopneumatic Device    PT Next Visit Plan  reassess HEP    Consulted and Agree with Plan of Care  Patient       Patient will benefit from skilled therapeutic intervention in order to improve the following deficits and impairments:  Hypomobility, Decreased activity tolerance, Decreased Zimmerman, Increased fascial restricitons, Impaired UE functional use, Pain, Increased muscle spasms, Decreased range of motion, Improper body mechanics, Postural dysfunction, Impaired flexibility  Visit Diagnosis: Cervicalgia  Abnormal posture  Other symptoms and signs involving the musculoskeletal  system  Muscle weakness (generalized)     Problem List Patient Active Problem List   Diagnosis Date Noted  . Neck pain 05/12/2017  . IBS (irritable bowel syndrome) 04/14/2017  . Overweight (BMI 25.0-29.9) 04/14/2017  . Right shoulder pain 04/14/2017  . Allergic rhinitis 03/29/2016  . Diverticulosis 03/29/2016  . History of pituitary tumor 03/29/2016  . Hypothyroidism 03/29/2016  . Vertigo 03/29/2016  . Vestibular migraine 03/29/2016     Janene Harvey, PT, DPT 04/26/18 6:12 PM   [facility] [address]  [place], [place], [address] Phone: [phone]   Fax:  [phone]  Name: Stefania Goulart MRN: [id number] Date of Birth: [birth year]

## 2018-05-01 ENCOUNTER — Encounter: Payer: Self-pay | Admitting: Physical Therapy

## 2018-05-01 ENCOUNTER — Telehealth: Payer: Self-pay | Admitting: Family Medicine

## 2018-05-01 ENCOUNTER — Ambulatory Visit: Payer: 59 | Admitting: Physical Therapy

## 2018-05-01 DIAGNOSIS — M6281 Muscle weakness (generalized): Secondary | ICD-10-CM

## 2018-05-01 DIAGNOSIS — M542 Cervicalgia: Secondary | ICD-10-CM

## 2018-05-01 DIAGNOSIS — R29898 Other symptoms and signs involving the musculoskeletal system: Secondary | ICD-10-CM

## 2018-05-01 DIAGNOSIS — R293 Abnormal posture: Secondary | ICD-10-CM

## 2018-05-01 MED ORDER — MELOXICAM 15 MG PO TABS: 15.0000 mg | ORAL_TABLET | Freq: Every day | ORAL | 2 refills | Status: DC

## 2018-05-01 NOTE — Telephone Encounter (Signed)
Sent script in for meloxicam.  Ask that she not take aleve or ibuprofen with this.    It's ok for her to take the robaxin with this though.  Thanks!

## 2018-05-01 NOTE — Telephone Encounter (Signed)
Patient is still experiencing some swelling and discomfort around the base of her neck. Asking for a prescription of meloxicam if you think that would help.   Pharmacy: [facility]   She is currently in physical therapy.

## 2018-05-01 NOTE — Therapy (Addendum)
[facility] [address]  [place], [place], [address] Phone: [phone]   Fax:  [phone]  Physical Therapy Treatment  Patient Details  Name: Kristine Zimmerman MRN: [id number] Date of Birth: [birth year] Referring Provider (PT): Karlton Lemon, MD   Encounter Date: 05/01/2018  PT End of Session - 05/01/18 1808    Visit Number  2    Number of Visits  13    Date for PT Re-Evaluation  06/07/18    Authorization Type  UHC; VL 23    PT Start Time  1616    PT Stop Time  1707   moist heat   PT Time Calculation (min)  51 min    Activity Tolerance  Patient tolerated treatment well;Patient limited by pain    Behavior During Therapy  [facility] for tasks assessed/performed       Past Medical History:  Diagnosis Date  . Brain tumor (benign) ([place])   . Chronic right shoulder pain   . Diverticulitis   . Migraines   . Thyroid disease   . Vertigo     Past Surgical History:  Procedure Laterality Date  . BRAIN SURGERY    . COSMETIC SURGERY     Facial surgery due to dog bite  . ENDOMETRIAL ABLATION    . NASAL SINUS SURGERY      There were no vitals filed for this visit.  Subjective Assessment - 05/01/18 1618    Subjective  Patient reports that she did not have a good night last night d/t pain and a HA.     Pertinent History  benign brain tumor, vertigo, thyroid disease, migraine, chronic R shoulder pain    Diagnostic tests  per- MD "disc osteophyte complexes at multiple levels C4-5 through C6-7 but worse is at C5-6 with severe right foraminal stenosis and moderate spinal stenosis"    Patient Stated Goals  get rid of pain    Currently in Pain?  Yes    Pain Score  8     Pain Location  Neck    Pain Orientation  Right;Left;Posterior    Pain Descriptors / Indicators  Tightness    Pain Type  Chronic pain                       [facility] Adult PT Treatment/Exercise - 05/01/18 0001      Exercises   Exercises   Neck;Shoulder      Neck Exercises: Machines for Strengthening   UBE (Upper Arm Bike)  L1 3 min forward/3 min backward      Neck Exercises: Seated   Neck Retraction  10 reps    Neck Retraction Limitations  cues to avoid over-protraction    Other Seated Exercise  R & L rotation SNAG x5 each side to tolerance    Other Seated Exercise  B scapular retraction 10x3"      Modalities   Modalities  Moist Heat      Moist Heat Therapy   Number Minutes Moist Heat  10 Minutes    Moist Heat Location  Cervical      Manual Therapy   Manual Therapy  Soft tissue mobilization;Myofascial release    Soft tissue mobilization  STM to B UT, LS, suboccopitals- slightly more tone on R but tender throughout    Myofascial Release  manual TPR to B UT, LS, suboccipitlas      Neck Exercises: Stretches   Upper Trapezius Stretch  Right;Left;2 reps;30  seconds    Upper Trapezius Stretch Limitations  with strap to tolerance    Levator Stretch  Right;Left;2 reps;30 seconds    Levator Stretch Limitations  cues to avoid pushing into pain   c/o chest tightness/spasm with L LS- no SOB or diaphoresis   Other Neck Stretches  R & L scalene stretch 30" each side to tolerance               PT Short Term Goals - 05/01/18 1811      PT SHORT TERM GOAL #1   Title  Patient to be independent with initial HEP.    Time  3    Period  Weeks    Status  On-going    Target Date  05/17/18        PT Long Term Goals - 05/01/18 1811      PT LONG TERM GOAL #1   Title  Patient to be independent with advanced HEP.    Time  6    Period  Weeks    Status  On-going      PT LONG TERM GOAL #2   Title  Patient to demonstrate [facility] and with <3/10 pain with cervical AROM.    Time  6    Period  Weeks    Status  On-going      PT LONG TERM GOAL #3   Title  Patient to demonstrate >=4+/5 strength in B UEs.    Time  6    Period  Weeks    Status  On-going      PT LONG TERM GOAL #4   Title  Patient to report 50% improvement in  sleeping tolerance.     Time  6    Period  Weeks    Status  On-going      PT LONG TERM GOAL #5   Title  Patient to report no difficulty with checking blind spot while driving.     Time  6    Period  Weeks    Status  On-going            Plan - 05/01/18 1809    Clinical Impression Statement  Patient arrived to session with report of HA d/t allergies and having difficulty sleeping d/t neck pain last night. Admits to not performing HEP consistently d/t HA. Tolerated STM and TPR to B UT, LS, suboccipitals- patient with increased tone on B sides, R>L and with tenderness throughout. Reviewed HEP with patient requiring consistent cues to correct form with cervical retractions and rotation SNAG. Patient with limited tolerance of cervical stretching d/t pain and with c/o intermittent report of short "chest spasms" with ther-ex but patient reporting that this is an ongoing issue and denied SOB or diaphoresis. Ended session with moist heat to cervical spine for pain and muscle tension. Patient reported improvement in symptoms and with no complaints at end of session.     Comorbidities  benign brain tumor, vertigo, thyroid disease, migraine, chronic R shoulder pain, sacral fx Oct 2019    PT Treatment/Interventions  ADLs/Self Care Home Management;Cryotherapy;Electrical Stimulation;Functional mobility training;Ultrasound;Traction;Moist Heat;Therapeutic activities;Therapeutic exercise;Neuromuscular re-education;Patient/family education;Passive range of motion;Manual techniques;Dry needling;Energy conservation;Splinting;Taping;Vasopneumatic Device    PT Next Visit Plan  STM to neck; stretching to tolerance    Consulted and Agree with Plan of Care  Patient       Patient will benefit from skilled therapeutic intervention in order to improve the following deficits and impairments:     Visit Diagnosis: Cervicalgia  Abnormal posture  Other symptoms and signs involving the musculoskeletal system  Muscle  weakness (generalized)     Problem List Patient Active Problem List   Diagnosis Date Noted  . Neck pain 05/12/2017  . IBS (irritable bowel syndrome) 04/14/2017  . Overweight (BMI 25.0-29.9) 04/14/2017  . Right shoulder pain 04/14/2017  . Allergic rhinitis 03/29/2016  . Diverticulosis 03/29/2016  . History of pituitary tumor 03/29/2016  . Hypothyroidism 03/29/2016  . Vertigo 03/29/2016  . Vestibular migraine 03/29/2016     Janene Harvey, PT, DPT 05/01/18 6:12 PM   [facility] [address]  [place], [place], [address] Phone: [phone]   Fax:  [phone]  Name: Cherly Erno MRN: [id number] Date of Birth: 27-Feb-1970  PHYSICAL THERAPY DISCHARGE SUMMARY  Visits from Start of Care: 2  Current functional level related to goals / functional outcomes: Unable to assess; patient did not return   Remaining deficits: Unable to assess   Education / Equipment: HEP  Plan: Patient agrees to discharge.  Patient goals were not met. Patient is being discharged due to not returning since the last visit.  ?????     Janene Harvey, PT, DPT 06/05/18 10:47 AM

## 2018-05-01 NOTE — Telephone Encounter (Signed)
Patient was informed and verbalized understanding of not taking aleve or ibuprofen with meloxicam

## 2018-05-04 ENCOUNTER — Ambulatory Visit: Payer: 59 | Admitting: Physical Therapy

## 2018-05-08 ENCOUNTER — Ambulatory Visit: Payer: 59 | Admitting: Physical Therapy

## 2018-05-11 ENCOUNTER — Ambulatory Visit: Payer: 59

## 2018-05-15 ENCOUNTER — Ambulatory Visit: Payer: 59 | Admitting: Physical Therapy

## 2018-05-17 ENCOUNTER — Other Ambulatory Visit: Payer: Self-pay | Admitting: Family Medicine

## 2018-05-17 MED ORDER — METHOCARBAMOL 500 MG PO TABS: 500.0000 mg | ORAL_TABLET | Freq: Three times a day (TID) | ORAL | 1 refills | Status: DC | PRN

## 2018-05-17 NOTE — Progress Notes (Signed)
Refill request for robaxin - sent in.

## 2018-05-18 ENCOUNTER — Encounter: Payer: 59 | Admitting: Physical Therapy

## 2018-05-22 ENCOUNTER — Ambulatory Visit: Payer: 59

## 2018-05-24 ENCOUNTER — Telehealth: Payer: Self-pay | Admitting: Physical Therapy

## 2018-05-25 ENCOUNTER — Encounter: Payer: 59 | Admitting: Physical Therapy

## 2018-05-29 ENCOUNTER — Ambulatory Visit (INDEPENDENT_AMBULATORY_CARE_PROVIDER_SITE_OTHER): Payer: 59 | Admitting: Family Medicine

## 2018-05-29 ENCOUNTER — Other Ambulatory Visit: Payer: Self-pay

## 2018-05-29 ENCOUNTER — Encounter: Payer: Self-pay | Admitting: Family Medicine

## 2018-05-29 VITALS — BP 113/77 | HR 75 | Temp 98.2°F | Ht 65.0 in | Wt 168.0 lb

## 2018-05-29 DIAGNOSIS — M545 Low back pain: Secondary | ICD-10-CM

## 2018-05-29 DIAGNOSIS — G8929 Other chronic pain: Secondary | ICD-10-CM

## 2018-05-29 DIAGNOSIS — M791 Myalgia, unspecified site: Secondary | ICD-10-CM

## 2018-05-29 DIAGNOSIS — M542 Cervicalgia: Secondary | ICD-10-CM | POA: Diagnosis not present

## 2018-05-29 MED ORDER — DICLOFENAC SODIUM 75 MG PO TBEC: 75.0000 mg | DELAYED_RELEASE_TABLET | Freq: Two times a day (BID) | ORAL | 1 refills | Status: DC

## 2018-05-29 MED ORDER — BACLOFEN 10 MG PO TABS: 10.0000 mg | ORAL_TABLET | Freq: Three times a day (TID) | ORAL | 1 refills | Status: DC | PRN

## 2018-05-29 NOTE — Progress Notes (Signed)
PCP: Charline Bills, MD  Subjective:   HPI: Patient is a 48 y.o. female here for diffuse upper and lower back pain.  Patient presents today with the complaint of worsening neck, upper back, and lower back pain over the last month. Can be up to 9/10 at times. She does have a history of right-sided neck pain with some radicular symptoms.  She was recently in physical therapy and was scheduled for another epidural steroid injection but this was delayed due to social distancing.  In addition to this neck pain, she has complaints of myalgias.  These seem to have started somewhat in relation to a persistent sinus infection that she is currently being treated for.  She also notes working significant more hours per week, approximately 60 hours/week.  She denies any focal weakness.  She does note occasional numbness/tingling in the right arm and hand but this is not new and has been intermittent since her pre-existing neck pain.  She denies any new numbness or tingling in the feet.  She denies any rash or skin changes.  Past Medical History:  Diagnosis Date  . Brain tumor (benign) ([place])   . Chronic right shoulder pain   . Diverticulitis   . Migraines   . Thyroid disease   . Vertigo     Current Outpatient Medications on File Prior to Visit  Medication Sig Dispense Refill  . levofloxacin (LEVAQUIN) 500 MG tablet Take by mouth.    . levothyroxine (SYNTHROID, LEVOTHROID) 100 MCG tablet Take 100 mcg by mouth daily before breakfast.    . montelukast (SINGULAIR) 10 MG tablet Take 10 mg by mouth at bedtime.    . ondansetron (ZOFRAN ODT) 4 MG disintegrating tablet Take 1 tablet (4 mg total) by mouth every 6 (six) hours as needed for nausea or vomiting. 20 tablet 0  . SUMAtriptan (IMITREX) 100 MG tablet TAKE ONE TABLET FOR MIGRAINE RELIEF. MAY REPEAT 2 HOURS LATER. MAXIMUM 2 PER DAY.    . traMADol (ULTRAM) 50 MG tablet Take 1 tablet (50 mg total) by mouth every 6 (six) hours as needed. (Patient not taking:  Reported on 04/26/2018) 20 tablet 0   No current facility-administered medications on file prior to visit.     Past Surgical History:  Procedure Laterality Date  . BRAIN SURGERY    . COSMETIC SURGERY     Facial surgery due to dog bite  . ENDOMETRIAL ABLATION    . NASAL SINUS SURGERY      Allergies  Allergen Reactions  . Morphine Swelling    Social History   Socioeconomic History  . Marital status: Divorced    Spouse name: Not on file  . Number of children: Not on file  . Years of education: Not on file  . Highest education level: Not on file  Occupational History  . Not on file  Social Needs  . Financial resource strain: Not on file  . Food insecurity:    Worry: Not on file    Inability: Not on file  . Transportation needs:    Medical: Not on file    Non-medical: Not on file  Tobacco Use  . Smoking status: Never Smoker  . Smokeless tobacco: Never Used  Substance and Sexual Activity  . Alcohol use: No  . Drug use: No  . Sexual activity: Yes    Birth control/protection: Surgical  Lifestyle  . Physical activity:    Days per week: Not on file    Minutes per session: Not on file  .  Stress: Not on file  Relationships  . Social connections:    Talks on phone: Not on file    Gets together: Not on file    Attends religious service: Not on file    Active member of club or organization: Not on file    Attends meetings of clubs or organizations: Not on file    Relationship status: Not on file  . Intimate partner violence:    Fear of current or ex partner: Not on file    Emotionally abused: Not on file    Physically abused: Not on file    Forced sexual activity: Not on file  Other Topics Concern  . Not on file  Social History Narrative  . Not on file    No family history on file.  BP 113/77   Pulse 75   Temp 98.2 F (36.8 C) (Oral)   Ht 5\' 5"  (1.651 m)   Wt 168 lb (76.2 kg)   BMI 27.96 kg/m   Review of Systems: See HPI above.     Objective:   Physical Exam:  Gen: awake, alert, NAD, comfortable in exam room Pulm: breathing unlabored  Cervical spine: No obvious deformity.  No rash or skin changes. Diffuse tenderness over the cervical spine and paraspinal muscles.  She also has tenderness over trapezius bilaterally.  Additionally, there is tenderness over the deltoids bilaterally as well as the thoracic paraspinals She has somewhat limited and guarded cervical range of motion, primarily with rotation.   Bilateral upper extremities: Sensation is intact in the upper extremities bilaterally. 5/5 strength Full range of motion of the elbows and wrists bilaterally 2+ DTRs bilaterally  Lumbar spine: - Inspection: no gross deformity or asymmetry, swelling or ecchymosis. No skin changes - Palpation: She is diffusely tender over the lumbar spine and lumbar paraspinal muscles.  She is tender over the SI joints bilaterally.  She is tender over the sacrum. - Strength: 5/5 strength of lower extremity in L4-S1 nerve root distributions b/l - Neuro: sensation intact in the L4-S1 nerve root distribution b/l, 2+ L4 and S1 reflexes - Special testing: Negative straight leg raise   Assessment & Plan:  1. Myalgias - pt does have chronic neck pain with intermittent radicular symptoms. Additionally she has diffuse myalgias which are likely multifactorial, related to significantly longer work days, underlying chronic neck and back pain. No concerning focal findings or neurologic deficits. Doubt any autoimmune or rheumatologic cause at this time.  - baclofen - diclofenac - heat - gentle stretching exercises - consider steroid dosepack if not improving - f/u by phone 1 week

## 2018-05-29 NOTE — Patient Instructions (Signed)
Try diclofenac 75mg  twice a day with food for pain and inflammation. Stop the meloxicam. Try baclofen as needed for muscle spasms - hopefully this doesn't make you sleepy. Do easy motion and strengthening exercises of your back and neck. Heat 15 minutes at a time 3-4 times a day. Call me in a week to let me know how you're doing. If not improving we can try steroid dose pack but this lowers your immune system and we need to avoid this if possible given the current pandemic.

## 2018-05-30 ENCOUNTER — Encounter: Payer: Self-pay | Admitting: Family Medicine

## 2018-06-01 ENCOUNTER — Encounter: Payer: 59 | Admitting: Physical Therapy

## 2018-06-05 ENCOUNTER — Encounter: Payer: 59 | Admitting: Physical Therapy

## 2018-06-08 ENCOUNTER — Encounter: Payer: 59 | Admitting: Physical Therapy

## 2018-06-12 ENCOUNTER — Telehealth: Payer: Self-pay | Admitting: Family Medicine

## 2018-06-13 NOTE — Telephone Encounter (Signed)
We discussed next step would be to try prednisone dose pack if she wasn't improving - let me know if she wants to try this.  They're not doing non-emergent surgeries right now but if she would like to see neurosurgeon for her neck i'm ok with referral to discuss options.

## 2018-06-21 ENCOUNTER — Ambulatory Visit: Payer: 59 | Attending: Family Medicine | Admitting: Physical Therapy

## 2018-06-21 ENCOUNTER — Other Ambulatory Visit: Payer: Self-pay

## 2018-06-21 ENCOUNTER — Encounter: Payer: Self-pay | Admitting: Physical Therapy

## 2018-06-21 DIAGNOSIS — M6281 Muscle weakness (generalized): Secondary | ICD-10-CM | POA: Insufficient documentation

## 2018-06-21 DIAGNOSIS — M542 Cervicalgia: Secondary | ICD-10-CM

## 2018-06-21 DIAGNOSIS — R29898 Other symptoms and signs involving the musculoskeletal system: Secondary | ICD-10-CM | POA: Diagnosis present

## 2018-06-21 DIAGNOSIS — R293 Abnormal posture: Secondary | ICD-10-CM | POA: Insufficient documentation

## 2018-06-21 NOTE — Therapy (Signed)
[facility] [address]  [place], [place], [address] Phone: [phone]   Fax:  [phone]  Physical Therapy Treatment  Patient Details  Name: Kristine Zimmerman MRN: [id number] Date of Birth: April 26, 1970 Referring Provider (PT): Karlton Lemon, MD   Encounter Date: 06/21/2018  PT End of Session - 06/21/18 1445    Visit Number  3    Number of Visits  15    Date for PT Re-Evaluation  08/02/18    Authorization Type  [employer]; VL 23    PT Start Time  1315    PT Stop Time  1421    PT Time Calculation (min)  66 min    Activity Tolerance  Patient tolerated treatment well;Patient limited by pain    Behavior During Therapy  [facility] for tasks assessed/performed       Past Medical History:  Diagnosis Date  . Brain tumor (benign) ([place])   . Chronic right shoulder pain   . Diverticulitis   . Migraines   . Thyroid disease   . Vertigo     Past Surgical History:  Procedure Laterality Date  . BRAIN SURGERY    . COSMETIC SURGERY     Facial surgery due to dog bite  . ENDOMETRIAL ABLATION    . NASAL SINUS SURGERY      There were no vitals filed for this visit.  Subjective Assessment - 06/21/18 1316    Subjective  Patient reports that she has been sick with a sinus infection for a month- has been on several different medications. Has also been very busy at work. Went to F/U with sports med MD d/t muscle aches. R index finger has been losing strength and sensation- unable to open a sode bottle and this has been getting worse but these symptoms go away with ice. Neck pain and tingling on R upper shoulder has been getting worse. Neck pain is symmetrical but no N/T in L hand.     Pertinent History  benign brain tumor, vertigo, thyroid disease, migraine, chronic R shoulder pain    Diagnostic tests  per- MD "disc osteophyte complexes at multiple levels C4-5 through C6-7 but worse is at C5-6 with severe right foraminal stenosis and moderate  spinal stenosis"    Patient Stated Goals  get rid of pain    Currently in Pain?  Yes    Pain Score  6     Pain Location  Neck    Pain Orientation  Right;Left    Pain Descriptors / Indicators  Aching    Pain Type  Chronic pain    Multiple Pain Sites  Yes    Pain Score  9    Pain Location  Finger (Comment which one)    Pain Orientation  Right   R 2nd digit   Pain Descriptors / Indicators  --   bruising   Pain Type  Acute pain         [facility] PT Assessment - 06/21/18 0001      Assessment   Medical Diagnosis  Neck pain on R side    Referring Provider (PT)  Karlton Lemon, MD    Onset Date/Surgical Date  04/25/16      AROM   AROM Assessment Site  Cervical    Cervical Flexion  35   moderate pain   Cervical Extension  32   severe neck pain   Cervical - Right Side Bend  35   severe pain  Cervical - Left Side Bend  30   moderate pain   Cervical - Right Rotation  moderately limited   moderate pain   Cervical - Left Rotation  severely limited   severe pain     Strength   Right Shoulder Flexion  4+/5    Right Shoulder ABduction  4+/5   c/o cramping in elbow   Right Shoulder Internal Rotation  4+/5    Right Shoulder External Rotation  4+/5    Left Shoulder Flexion  4+/5    Left Shoulder ABduction  4+/5    Left Shoulder Internal Rotation  4/5    Left Shoulder External Rotation  4/5    Right Elbow Flexion  4+/5    Right Elbow Extension  4+/5    Left Elbow Flexion  4+/5    Left Elbow Extension  4+/5    Right Wrist Flexion  4+/5    Right Wrist Extension  4+/5    Left Wrist Flexion  4/5    Left Wrist Extension  4/5    Right Hand Grip (lbs)  1   limited by R hand pain   Right Hand Lateral Pinch  4 lbs   limited by R hand pain   Left Hand Grip (lbs)  19    Left Hand Lateral Pinch  9 lbs                   [facility] Adult PT Treatment/Exercise - 06/21/18 0001      Neck Exercises: Seated   Neck Retraction  10 reps    Neck Retraction Limitations  cues for proper  positioning    Other Seated Exercise  B scapular retraction 10x3"      Modalities   Modalities  Electrical Stimulation      Moist Heat Therapy   Number Minutes Moist Heat  15 Minutes    Moist Heat Location  Cervical      Electrical Stimulation   Electrical Stimulation Location  B UT    Electrical Stimulation Action  IFC    Electrical Stimulation Parameters  output: 6 to tolerance; 15 min    Electrical Stimulation Goals  Tone;Pain      Manual Therapy   Manual Therapy  Soft tissue mobilization;Myofascial release    Soft tissue mobilization  STM to B UT, LS, scalenes- severely tight thorughout with more tenderness on L    Myofascial Release  manual TPR to B UT, LS, scalenes      Neck Exercises: Stretches   Other Neck Stretches  B corner pec stretch 30"             PT Education - 06/21/18 1444    Education Details  update and review of previously administered HEP; advised patient to F/U with MD about R hand swelling, pain, and redness    Person(s) Educated  Patient    Methods  Explanation;Demonstration;Tactile cues;Verbal cues;Handout    Comprehension  Verbalized understanding;Returned demonstration       PT Short Term Goals - 06/21/18 1447      PT SHORT TERM GOAL #1   Title  Patient to be independent with initial HEP.    Time  3    Period  Weeks    Status  On-going    Target Date  07/12/18        PT Long Term Goals - 06/21/18 1341      PT LONG TERM GOAL #1   Title  Patient to be independent with advanced  HEP.    Time  6    Period  Weeks    Status  On-going   performing HEP to tolerance   Target Date  08/02/18      PT LONG TERM GOAL #2   Title  Patient to demonstrate [facility] and with <3/10 pain with cervical AROM.    Time  6    Period  Weeks    Status  On-going   Cervical AROM today still painful and limited, with most pain with extension, L rotation, and R sidebending   Target Date  08/02/18      PT LONG TERM GOAL #3   Title  Patient to demonstrate  >=4+/5 strength in B UEs.    Time  6    Period  Weeks    Status  Partially Met   Proximal UE strength improved since last measurements, however R grip and pinch strength with significant weakness and pain   Target Date  08/02/18      PT LONG TERM GOAL #4   Title  Patient to report 50% improvement in sleeping tolerance.     Time  6    Period  Weeks    Status  On-going   reports 3-4 hours before waking up d/t pain   Target Date  08/02/18      PT LONG TERM GOAL #5   Title  Patient to report no difficulty with checking blind spot while driving.     Time  6    Period  Weeks    Status  On-going   reports that this is very difficult and using back-up assistance at all time   Target Date  08/02/18            Plan - 06/21/18 1446    Clinical Impression Statement  Patient arrived to session after long hiatus from PT d/t month-long sinus infection and increased workload. Reporting worsening in B neck pain and new onset of R 2nd digit pain. Fingers on R hand visibly edematous and with slight redness.  Cervical AROM today still painful and limited, with most pain with extension, L rotation, and R sidebending. Proximal UE strength improved since last measurements, however R grip and pinch strength with significant weakness and pain. Patient reports that she is still limited in sleeping and driving tolerance d/t neck pain. Patient tolerated STM and TPR to B UT, LS, and scalenes to decrease severe muscle tension. Reviewed HEP and provided modification to standing pec stretch as patient reporting pain in sacrum with this exercise. Corner pec stretch added to HEP and patient reported understanding. Received e-stim and heat to B UT for pain at end of session. Patient with no further complaints at end of session. Advised patient to F/U with MD next door d/t R hand symptoms- patient agreeable. Would benefit from continued skilled PT services 2x/week for 6 weeks to address goals and help manage pain.      Comorbidities  benign brain tumor, vertigo, thyroid disease, migraine, chronic R shoulder pain, sacral fx Oct 2019    PT Frequency  2x / week    PT Duration  6 weeks    PT Treatment/Interventions  ADLs/Self Care Home Management;Cryotherapy;Electrical Stimulation;Functional mobility training;Ultrasound;Traction;Moist Heat;Therapeutic activities;Therapeutic exercise;Neuromuscular re-education;Patient/family education;Passive range of motion;Manual techniques;Dry needling;Energy conservation;Splinting;Taping;Vasopneumatic Device    PT Next Visit Plan  STM to neck; stretching to tolerance    Consulted and Agree with Plan of Care  Patient       Patient will benefit from  skilled therapeutic intervention in order to improve the following deficits and impairments:  Hypomobility, Decreased activity tolerance, Decreased strength, Increased fascial restricitons, Impaired UE functional use, Pain, Increased muscle spasms, Decreased range of motion, Improper body mechanics, Postural dysfunction, Impaired flexibility  Visit Diagnosis: Cervicalgia  Abnormal posture  Other symptoms and signs involving the musculoskeletal system  Muscle weakness (generalized)     Problem List Patient Active Problem List   Diagnosis Date Noted  . Neck pain 05/12/2017  . IBS (irritable bowel syndrome) 04/14/2017  . Overweight (BMI 25.0-29.9) 04/14/2017  . Right shoulder pain 04/14/2017  . Allergic rhinitis 03/29/2016  . Diverticulosis 03/29/2016  . History of pituitary tumor 03/29/2016  . Hypothyroidism 03/29/2016  . Vertigo 03/29/2016  . Vestibular migraine 03/29/2016     Janene Harvey, PT, DPT 06/21/18 2:51 PM   [facility] [address]  [place], [place], [address] Phone: [phone]   Fax:  [phone]  Name: Latayna Ritchie MRN: [id number] Date of Birth: 1970/06/16

## 2018-06-23 ENCOUNTER — Other Ambulatory Visit: Payer: Self-pay

## 2018-06-23 ENCOUNTER — Ambulatory Visit: Payer: 59 | Attending: Family Medicine

## 2018-06-23 DIAGNOSIS — M542 Cervicalgia: Secondary | ICD-10-CM

## 2018-06-23 DIAGNOSIS — M6281 Muscle weakness (generalized): Secondary | ICD-10-CM

## 2018-06-23 DIAGNOSIS — R293 Abnormal posture: Secondary | ICD-10-CM

## 2018-06-23 DIAGNOSIS — R29898 Other symptoms and signs involving the musculoskeletal system: Secondary | ICD-10-CM

## 2018-06-23 NOTE — Therapy (Addendum)
[facility] [address]  [place], [place], [address] Phone: [phone]   Fax:  [phone]  Physical Therapy Treatment  Patient Details  Name: Kristine Zimmerman MRN: [id number] Date of Birth: 1970/08/27 Referring Provider (PT): Karlton Lemon, MD   Encounter Date: 06/23/2018  PT End of Session - 06/23/18 0911    Visit Number  4    Number of Visits  15    Date for PT Re-Evaluation  08/02/18    Authorization Type  UHC; VL 23    PT Start Time  0847    PT Stop Time  0945    PT Time Calculation (min)  58 min    Activity Tolerance  Patient tolerated treatment well;Patient limited by pain    Behavior During Therapy  [facility] for tasks assessed/performed       Past Medical History:  Diagnosis Date  . Brain tumor (benign) ([place])   . Chronic right shoulder pain   . Diverticulitis   . Migraines   . Thyroid disease   . Vertigo     Past Surgical History:  Procedure Laterality Date  . BRAIN SURGERY    . COSMETIC SURGERY     Facial surgery due to dog bite  . ENDOMETRIAL ABLATION    . NASAL SINUS SURGERY      There were no vitals filed for this visit.  Subjective Assessment - 06/23/18 0855    Subjective  Pt. noting she has had occasional dizziness throughout day laying supine.  Primary complaint today is L and R sided upper shoulder/neck pain.      Pertinent History  benign brain tumor, vertigo, thyroid disease, migraine, chronic R shoulder pain    Diagnostic tests  per- MD "disc osteophyte complexes at multiple levels C4-5 through C6-7 but worse is at C5-6 with severe right foraminal stenosis and moderate spinal stenosis"    Patient Stated Goals  get rid of pain    Currently in Pain?  Yes    Pain Score  5     Pain Location  Neck    Pain Orientation  Right;Left    Pain Descriptors / Indicators  Aching    Pain Type  Chronic pain                       [facility] Adult PT Treatment/Exercise - 06/23/18 0001      Neck Exercises: Supine   Neck Retraction  10 reps;5 secs    Neck Retraction Limitations  into pillow    complaint of dizziness x 30 sec following this      Shoulder Exercises: ROM/Strengthening   Cybex Row  10 reps    Cybex Row Limitations  10# - heavy tactile/verbal cues provided for scapular retraction/depression as pt. with difficulty understanding/demo    improved carryover for proper technique following cueing      Moist Heat Therapy   Number Minutes Moist Heat  15 Minutes    Moist Heat Location  Cervical      Electrical Stimulation   Electrical Stimulation Location  B UT    Electrical Stimulation Action  IFC    Electrical Stimulation Parameters  to tolerance,15'    Electrical Stimulation Goals  Tone;Pain      Manual Therapy   Manual Therapy  Soft tissue mobilization;Myofascial release    Soft tissue mobilization  STM to B UT, LS, rhomboids, mid trapezius scalenes- increased tone and tenderness in L UT, LS L>R  Myofascial Release  manual TPR to L UT, LS       Neck Exercises: Stretches   Upper Trapezius Stretch  Right;Left;2 reps;30 seconds    Upper Trapezius Stretch Limitations  with strap to tolerance    Levator Stretch  Right;Left;2 reps;30 seconds    Levator Stretch Limitations  cues to avoid pushing into pain    Other Neck Stretches  B corner pec stretch 30"               PT Short Term Goals - 06/21/18 1447      PT SHORT TERM GOAL #1   Title  Patient to be independent with initial HEP.    Time  3    Period  Weeks    Status  On-going    Target Date  07/12/18        PT Long Term Goals - 06/21/18 1341      PT LONG TERM GOAL #1   Title  Patient to be independent with advanced HEP.    Time  6    Period  Weeks    Status  On-going   performing HEP to tolerance   Target Date  08/02/18      PT LONG TERM GOAL #2   Title  Patient to demonstrate [facility] and with <3/10 pain with cervical AROM.    Time  6    Period  Weeks    Status  On-going   Cervical  AROM today still painful and limited, with most pain with extension, L rotation, and R sidebending   Target Date  08/02/18      PT LONG TERM GOAL #3   Title  Patient to demonstrate >=4+/5 strength in B UEs.    Time  6    Period  Weeks    Status  Partially Met   Proximal UE strength improved since last measurements, however R grip and pinch strength with significant weakness and pain   Target Date  08/02/18      PT LONG TERM GOAL #4   Title  Patient to report 50% improvement in sleeping tolerance.     Time  6    Period  Weeks    Status  On-going   reports 3-4 hours before waking up d/t pain   Target Date  08/02/18      PT LONG TERM GOAL #5   Title  Patient to report no difficulty with checking blind spot while driving.     Time  6    Period  Weeks    Status  On-going   reports that this is very difficult and using back-up assistance at all time   Target Date  08/02/18            Plan - 06/23/18 0911    Clinical Impression Statement  [name] primary complaint today is B upper shoulder/neck pain.  Noted increased tone/tenderness in L UT focused in L LS with manual therapy addressing this.  Pt. reporting improvement in comfort and reduction in pain following MT today.  Session focused on postural strengthening therex with cueing provided with scapular retraction required to avoid excessive scapular elevation with good carryover.  Ended visit with E-stim/moist heat to B upper shoulder to reduce overall tone/pain with good relief noted.  Will continue to progress toward goals.      Personal Factors and Comorbidities  Past/Current Experience;Profession;Comorbidity 3+;Time since onset of injury/illness/exacerbation    Comorbidities  benign brain tumor, vertigo, thyroid disease, migraine, chronic  R shoulder pain, sacral fx Oct 2019    Examination-Activity Limitations  Bathing;Reach Overhead;Sleep;Carry;Dressing;Hygiene/Grooming;Lift    Examination-Participation Restrictions  Personal  Finances;Cleaning;Community Activity;Shop;Driving;Interpersonal Relationship;Yard Work;Laundry;Meal Prep    Rehab Potential  Good    PT Treatment/Interventions  ADLs/Self Care Home Management;Cryotherapy;Electrical Stimulation;Functional mobility training;Ultrasound;Traction;Moist Heat;Therapeutic activities;Therapeutic exercise;Neuromuscular re-education;Patient/family education;Passive range of motion;Manual techniques;Dry needling;Energy conservation;Splinting;Taping;Vasopneumatic Device    PT Next Visit Plan  Continue STM to neck; stretching to tolerance    Consulted and Agree with Plan of Care  Patient       Patient will benefit from skilled therapeutic intervention in order to improve the following deficits and impairments:  Hypomobility, Decreased activity tolerance, Decreased strength, Increased fascial restricitons, Impaired UE functional use, Pain, Increased muscle spasms, Decreased range of motion, Improper body mechanics, Postural dysfunction, Impaired flexibility  Visit Diagnosis: Cervicalgia  Abnormal posture  Other symptoms and signs involving the musculoskeletal system  Muscle weakness (generalized)     Problem List Patient Active Problem List   Diagnosis Date Noted  . Neck pain 05/12/2017  . IBS (irritable bowel syndrome) 04/14/2017  . Overweight (BMI 25.0-29.9) 04/14/2017  . Right shoulder pain 04/14/2017  . Allergic rhinitis 03/29/2016  . Diverticulosis 03/29/2016  . History of pituitary tumor 03/29/2016  . Hypothyroidism 03/29/2016  . Vertigo 03/29/2016  . Vestibular migraine 03/29/2016    Bess Harvest, PTA 06/23/18 12:37 PM   [facility] [address]  [place], [place], [address] Phone: [phone]   Fax:  [phone]  Name: Kristine Zimmerman MRN: [id number] Date of Birth: 01/14/1971  PHYSICAL THERAPY DISCHARGE SUMMARY  Visits from Start of Care: 4  Current functional level related to  goals / functional outcomes: Unable to assess; patient cancelled all appointment d/t possible surgery    Remaining deficits: Unable to assess   Education / Equipment: HEP  Plan: Patient agrees to discharge.  Patient goals were not met. Patient is being discharged due to the patient's request.  ?????     Janene Harvey, PT, DPT 07/26/18 1:52 PM

## 2018-06-28 ENCOUNTER — Ambulatory Visit: Payer: 59

## 2018-06-29 DIAGNOSIS — M5412 Radiculopathy, cervical region: Secondary | ICD-10-CM | POA: Insufficient documentation

## 2018-06-30 ENCOUNTER — Ambulatory Visit: Payer: 59

## 2018-07-04 ENCOUNTER — Ambulatory Visit: Payer: 59

## 2018-07-06 ENCOUNTER — Ambulatory Visit: Payer: 59

## 2019-02-23 DIAGNOSIS — K567 Ileus, unspecified: Secondary | ICD-10-CM

## 2019-02-23 DIAGNOSIS — K56600 Partial intestinal obstruction, unspecified as to cause: Secondary | ICD-10-CM

## 2019-02-23 HISTORY — DX: Partial intestinal obstruction, unspecified as to cause: K56.600

## 2019-02-23 HISTORY — DX: Other postprocedural complications and disorders of digestive system: K56.7

## 2019-03-02 DIAGNOSIS — Z90710 Acquired absence of both cervix and uterus: Secondary | ICD-10-CM | POA: Insufficient documentation

## 2019-03-09 HISTORY — PX: LAPAROSCOPIC LYSIS OF ADHESIONS: SHX5905

## 2019-03-16 DIAGNOSIS — N951 Menopausal and female climacteric states: Secondary | ICD-10-CM | POA: Insufficient documentation

## 2019-03-29 DIAGNOSIS — Z981 Arthrodesis status: Secondary | ICD-10-CM | POA: Insufficient documentation

## 2019-04-05 ENCOUNTER — Other Ambulatory Visit: Payer: Self-pay | Admitting: *Deleted

## 2019-04-05 ENCOUNTER — Other Ambulatory Visit: Payer: Self-pay | Admitting: Neurosurgery

## 2019-04-05 DIAGNOSIS — M542 Cervicalgia: Secondary | ICD-10-CM

## 2019-04-05 MED ORDER — BACLOFEN 10 MG PO TABS: 10.0000 mg | ORAL_TABLET | Freq: Three times a day (TID) | ORAL | 0 refills | Status: DC | PRN

## 2019-04-09 ENCOUNTER — Ambulatory Visit
Admission: RE | Admit: 2019-04-09 | Discharge: 2019-04-09 | Disposition: A | Discharge status: Home / Self Care | Payer: No Typology Code available for payment source | Source: Ambulatory Visit | Attending: Neurosurgery | Admitting: Neurosurgery

## 2019-04-09 ENCOUNTER — Other Ambulatory Visit: Payer: Self-pay

## 2019-04-09 DIAGNOSIS — M542 Cervicalgia: Secondary | ICD-10-CM

## 2019-04-09 IMAGING — MR MR CERVICAL SPINE W/O CM
4 of 5 series · 28 of 48 positions shown · non-contrast
Comparison: Cervical spine MRI [DATE]

CLINICAL DATA: Chronic neck pain.  History of cervical fusion.

EXAM:
MRI CERVICAL SPINE WITHOUT CONTRAST
TECHNIQUE: Multiplanar, multisequence MR imaging of the cervical spine was
performed. No intravenous contrast was administered.

[Series 2: T2 · sagittal · 3.0mm · 0.66mm/px · 6 of 15 slices shown (1 of 2)]
[im 1/15]
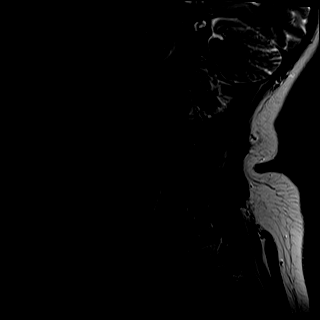
[im 3/15]
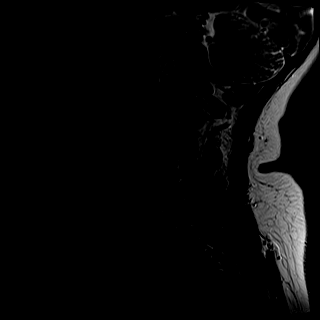
[im 6/15]
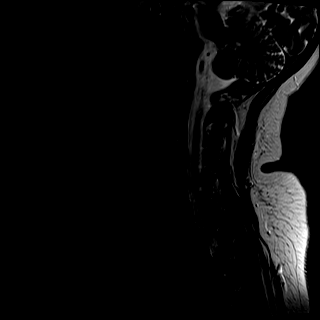
[im 9/15]
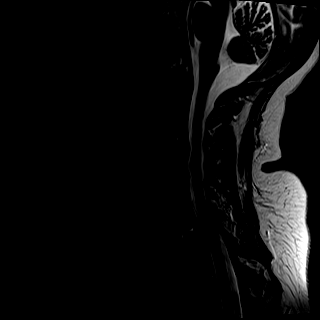
[im 12/15]
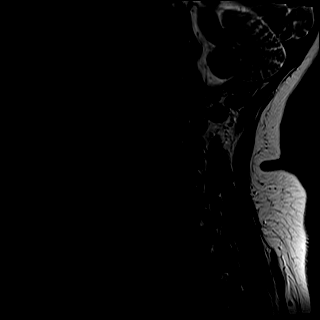
[im 15/15]
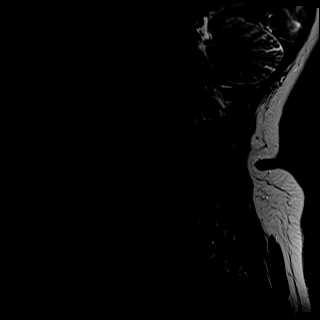

[Series 3: STIR · sagittal · 3.0mm · 0.41mm/px · 7 of 15 slices shown]
[im 1/15]
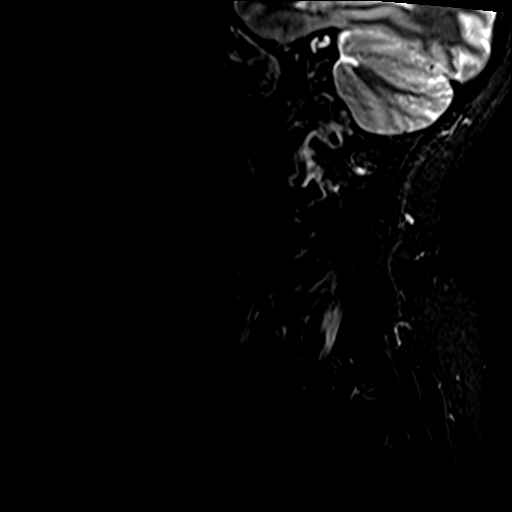
[im 3/15]
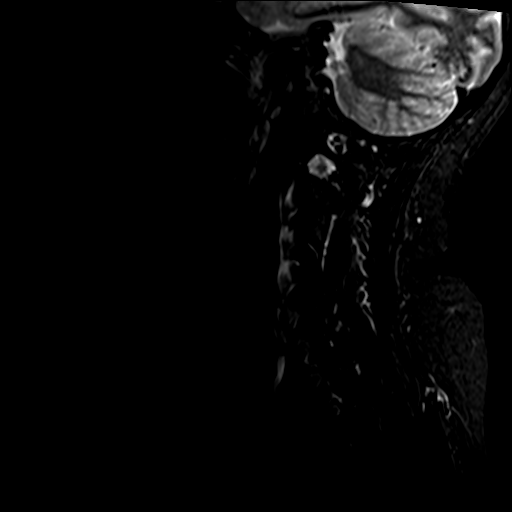
[im 5/15]
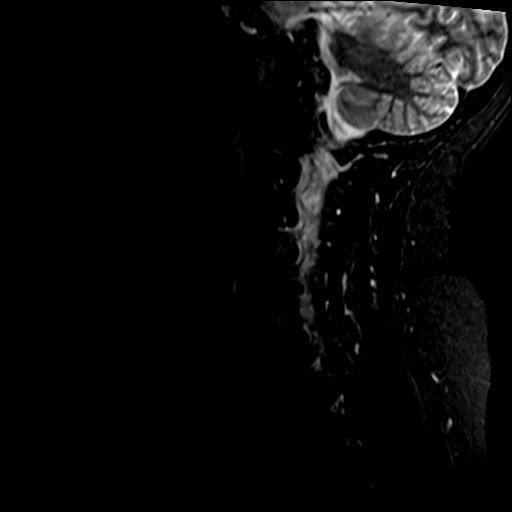
[im 8/15]
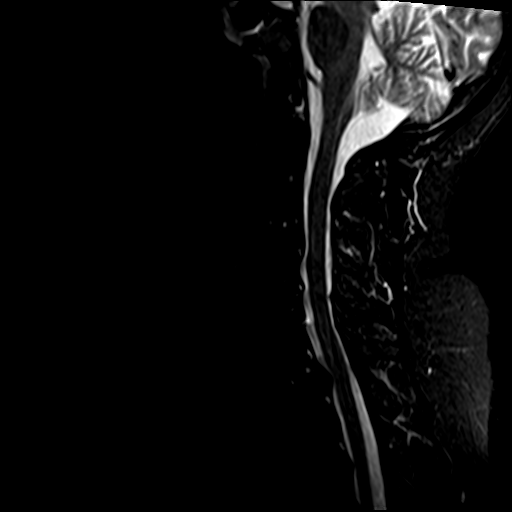
[im 10/15]
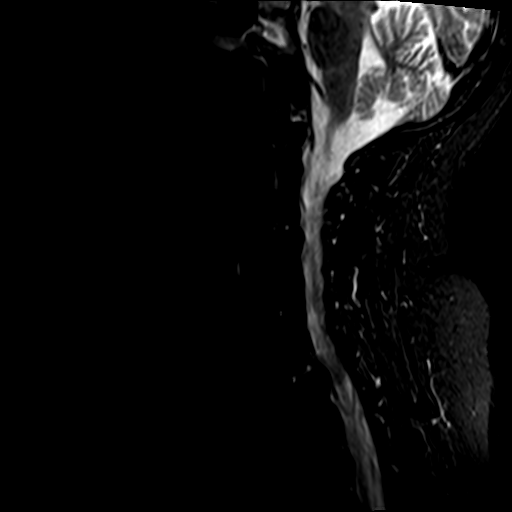
[im 12/15]
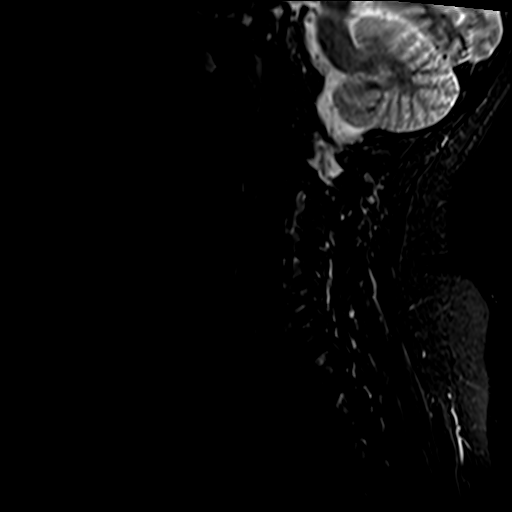
[im 15/15]
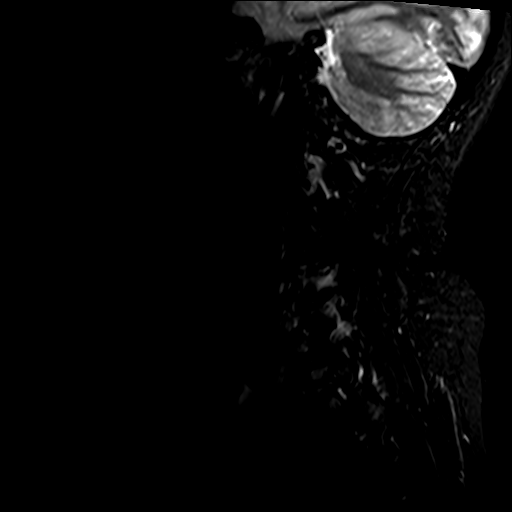

[Series 4: T1 · sagittal · 3.0mm · 0.41mm/px · 7 of 15 slices shown]
[im 1/15]
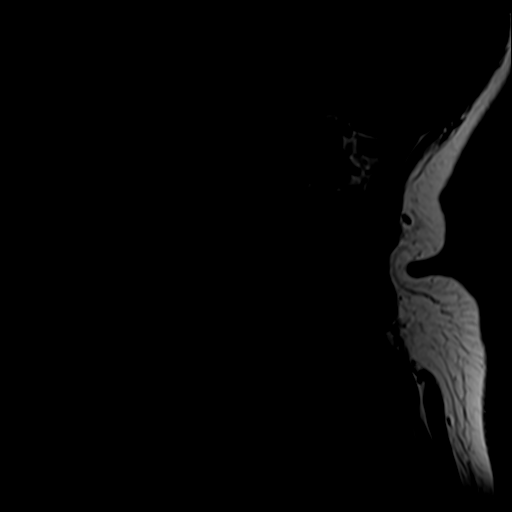
[im 3/15]
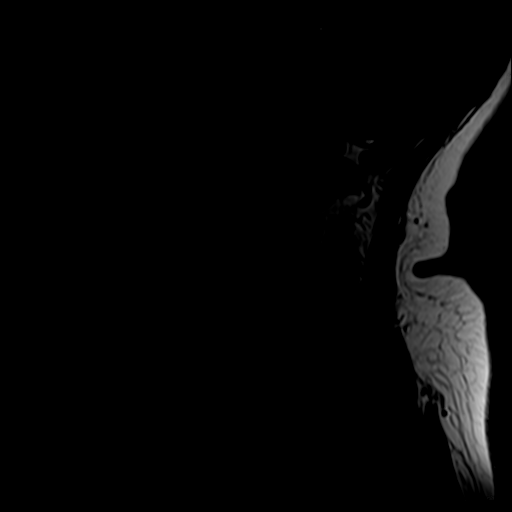
[im 5/15]
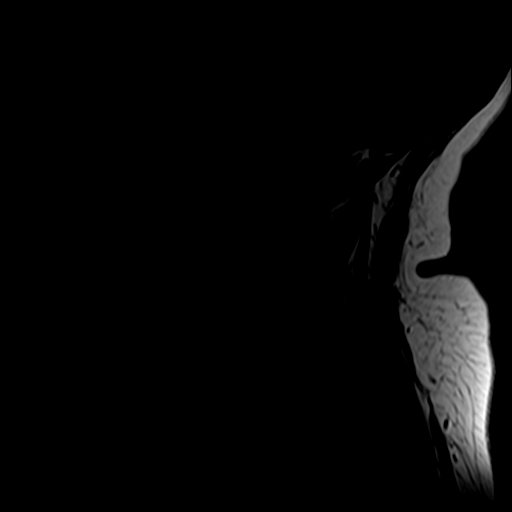
[im 8/15]
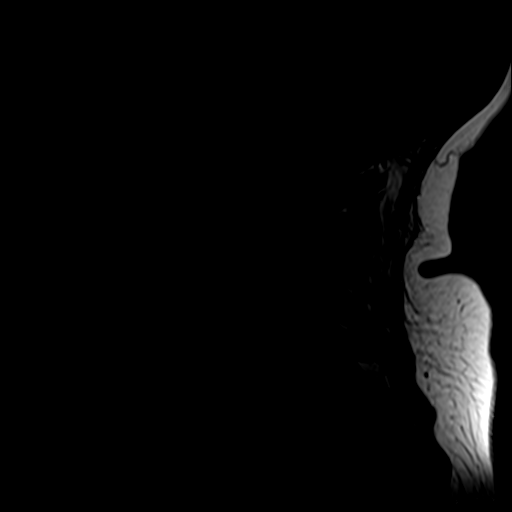
[im 10/15]
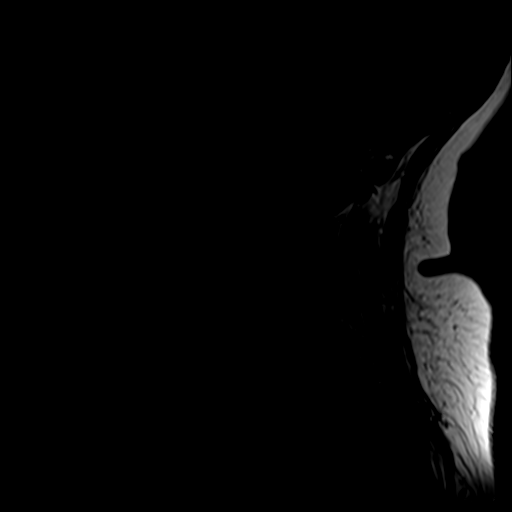
[im 12/15]
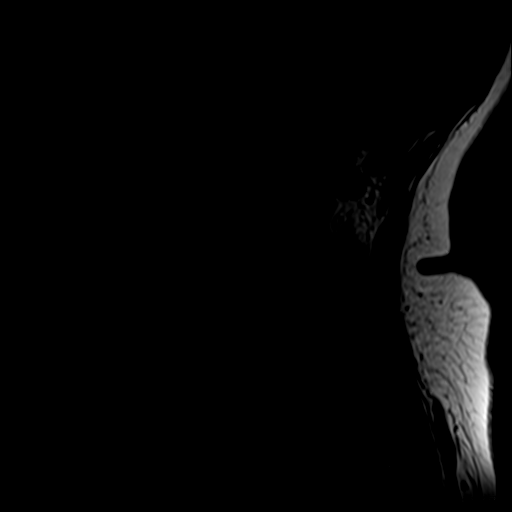
[im 15/15]
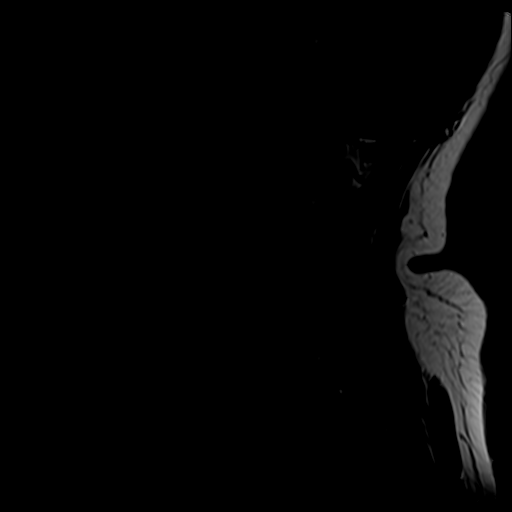

[Series 6: T2 · axial · 3.0mm · 0.70mm/px · z∈[-96,+12]mm · 8 of 31 slices shown (2 of 2)]
[im 1/31]
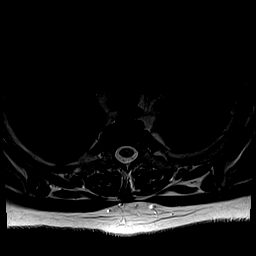
[im 5/31]
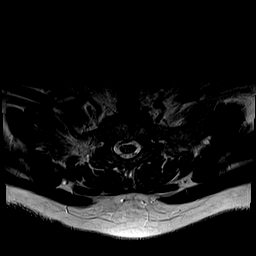
[im 10/31]
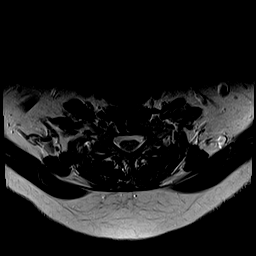
[im 14/31]
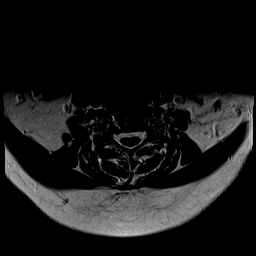
[im 17/31]
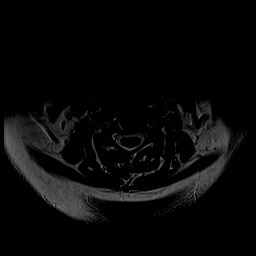
[im 21/31]
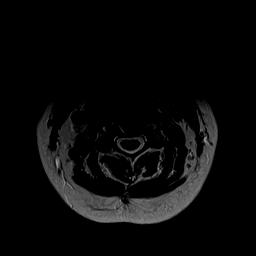
[im 26/31]
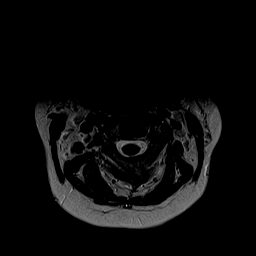
[im 31/31]
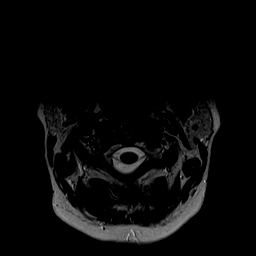

[28 of 48 positions shown; findings below may reference images not displayed]

FINDINGS: Alignment: Slight anterolisthesis C7-T1 unchanged. Remaining
alignment normal.

Vertebrae: Interval ACDF C4 through C7. Negative for fracture or
mass.

Cord: Normal spinal cord signal and morphology

Posterior Fossa, vertebral arteries, paraspinal tissues: Negative

Disc levels:

C2-3: Negative

C3-4: Mild right foraminal narrowing due to facet hypertrophy.
Spinal canal normal in size

C4-5: ACDF.  Negative for stenosis

C5-6: ACDF.  Mild facet degeneration.  Negative for stenosis

C6-7: ACDF.  Negative for stenosis

C7-T1: Mild anterolisthesis. Moderately large broad-based disc
protrusion has progressed in the interval. Cord flattening and mild
spinal stenosis with mild progression. Mild foraminal narrowing
bilaterally.
IMPRESSION: ACDF C3 through C7 with good decompression of the spinal canal. Mild
right foraminal narrowing at C3-4

Broad-based disc protrusion C7-T1 has progressed with mild spinal
and foraminal stenosis bilaterally.

## 2019-04-18 DIAGNOSIS — R140 Abdominal distension (gaseous): Secondary | ICD-10-CM | POA: Insufficient documentation

## 2019-04-30 ENCOUNTER — Other Ambulatory Visit: Payer: Self-pay | Admitting: Neurosurgery

## 2019-04-30 DIAGNOSIS — M542 Cervicalgia: Secondary | ICD-10-CM

## 2019-05-04 ENCOUNTER — Ambulatory Visit
Admission: RE | Admit: 2019-05-04 | Discharge: 2019-05-04 | Disposition: A | Discharge status: Home / Self Care | Payer: No Typology Code available for payment source | Source: Ambulatory Visit | Attending: Neurosurgery | Admitting: Neurosurgery

## 2019-05-04 ENCOUNTER — Other Ambulatory Visit: Payer: Self-pay

## 2019-05-04 DIAGNOSIS — M542 Cervicalgia: Secondary | ICD-10-CM

## 2019-05-04 IMAGING — XA DG INJECT/[PERSON_NAME] INC NEEDLE/CATH/PLC EPI/CERV/THOR W/IMG
2 series · 2 of 2 positions shown · non-contrast
Comparison: none

CLINICAL DATA: Cervical spondylosis without myelopathy. Pain in the
neck, shoulders, and arms bilaterally, overall right worse than
left. Previous C4-C7 ACDF.

[Series 1: ortho adipose · 1 of 1 slices shown (1 of 2)]
[im 1/1]
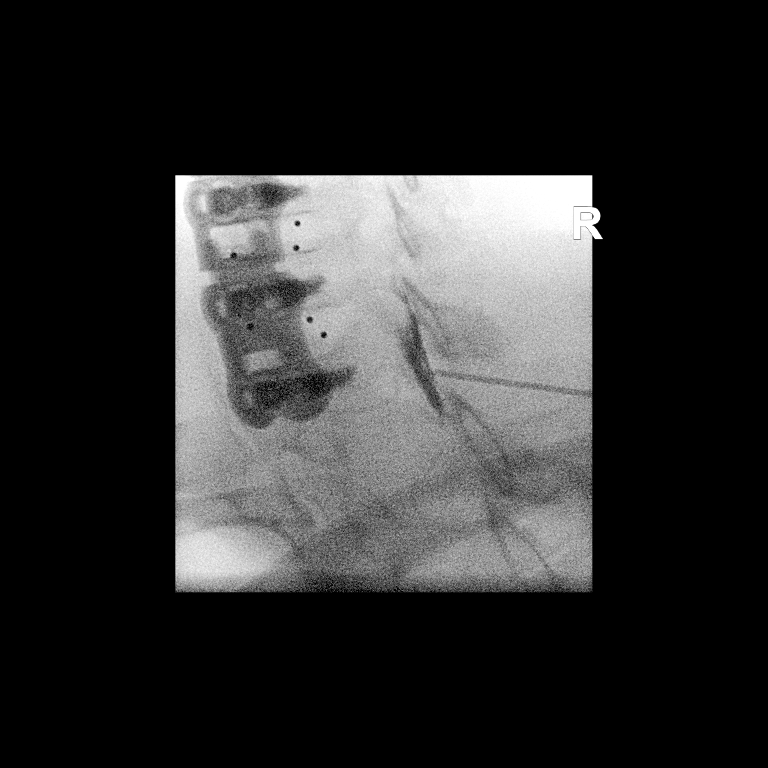

[Series 2: ortho adipose · 1 of 1 slices shown (2 of 2)]
[im 1/1]
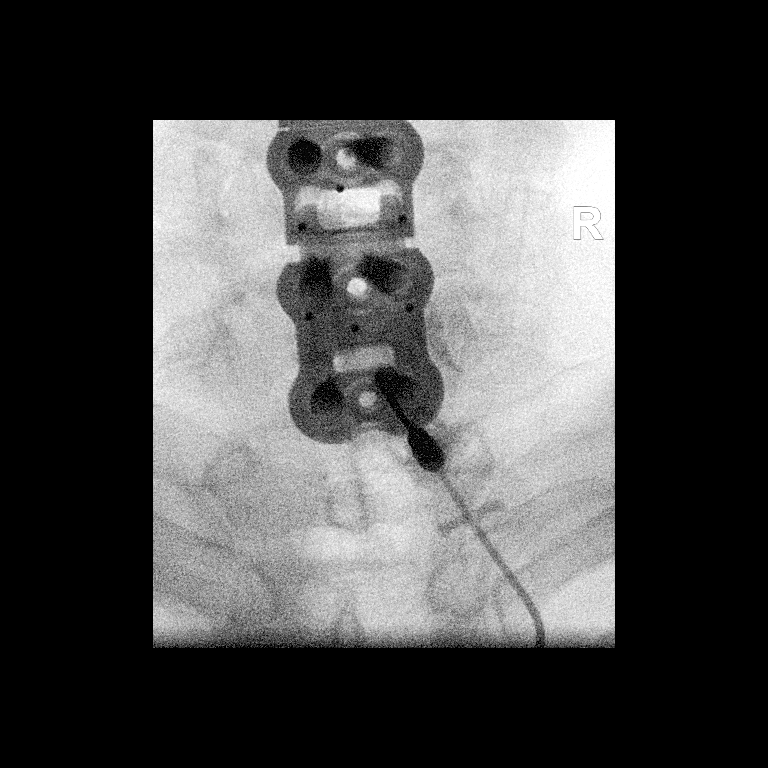

[2 of 2 positions shown; findings below may reference images not displayed]

FLUOROSCOPY TIME:  Fluoroscopy Time: 20 seconds

Radiation Exposure Index: 8.02 microGray*m^2

PROCEDURE:
The procedure, risks, benefits, and alternatives were explained to
the patient. Questions regarding the procedure were encouraged and
answered. The patient understands and consents to the procedure.

CERVICAL EPIDURAL INJECTION

An interlaminar approach was performed on the the right at C7-T1. A
3.5 inch 20 gauge epidural needle was advanced using
loss-of-resistance technique.

DIAGNOSTIC EPIDURAL INJECTION

Injection of Isovue-M 300 shows a good epidural pattern with spread
above and below the level of needle placement, primarily on the
right. No vascular opacification is seen.

THERAPEUTIC EPIDURAL INJECTION

1.5 ml of Kenalog 40 mixed with 2 ml of normal saline were then
instilled. The procedure was well-tolerated, and the patient was
discharged thirty minutes following the injection in good condition.
IMPRESSION: Technically successful interlaminar epidural injection on the right
at C7-T1.

## 2019-05-04 MED ORDER — TRIAMCINOLONE ACETONIDE 40 MG/ML IJ SUSP (RADIOLOGY)
60.0000 mg | Freq: Once | INTRAMUSCULAR | Status: AC
  Administered 2019-05-04: 60 mg via EPIDURAL

## 2019-05-04 MED ORDER — IOPAMIDOL (ISOVUE-M 300) INJECTION 61%
1.0000 mL | Freq: Once | INTRAMUSCULAR | Status: AC | PRN
  Administered 2019-05-04: 1 mL via EPIDURAL

## 2019-05-04 NOTE — Discharge Instructions (Signed)

## 2019-06-12 DIAGNOSIS — Z87898 Personal history of other specified conditions: Secondary | ICD-10-CM | POA: Insufficient documentation

## 2019-06-28 ENCOUNTER — Other Ambulatory Visit: Payer: Self-pay | Admitting: Neurosurgery

## 2019-07-11 NOTE — Progress Notes (Signed)
[place], [place] [address] [place] [address] Phone: [phone] Fax: [phone]      Your procedure is scheduled on Monday, Jul 16, 2019.  Report to [facility] Main Entrance "A" at 6:30 A.M., and check in at the Admitting office.  Call this number if you have problems the morning of surgery:  [phone]  Call [phone] if you have any questions prior to your surgery date Monday-Friday 8am-4pm    Remember:  Do not eat or drink after midnight the night before your surgery    Take these medicines the morning of surgery with A SIP OF WATER:  estradiol (ESTRACE) pantoprazole (PROTONIX) meclizine (ANTIVERT) - if needed  As of today, STOP taking any Aspirin (unless otherwise instructed by your surgeon) and Aspirin containing products, Aleve, Naproxen, Ibuprofen, Motrin, Advil, Goody's, BC's, all herbal medications, fish oil, and all vitamins.                      Do not wear jewelry, make up, or nail polish            Do not wear lotions, powders, perfumes, or deodorant.            Do not shave 48 hours prior to surgery.            Do not bring valuables to the hospital.            [facility] is not responsible for any belongings or valuables.  Do NOT Smoke (Tobacco/Vapping) or drink Alcohol 24 hours prior to your procedure If you use a CPAP at night, you may bring all equipment for your overnight stay.   Contacts, glasses, dentures or bridgework may not be worn into surgery.      For patients admitted to the hospital, discharge time will be determined by your treatment team.   Patients discharged the day of surgery will not be allowed to drive home, and someone needs to stay with them for 24 hours.    Special instructions:   [place]- Preparing For Surgery  Before surgery, you can play an important role. Because skin is not sterile, your skin needs to be as free of germs as possible. You can reduce the  number of germs on your skin by washing with CHG (chlorahexidine gluconate) Soap before surgery.  CHG is an antiseptic cleaner which kills germs and bonds with the skin to continue killing germs even after washing.    Oral Hygiene is also important to reduce your risk of infection.  Remember - BRUSH YOUR TEETH THE MORNING OF SURGERY WITH YOUR REGULAR TOOTHPASTE  Please do not use if you have an allergy to CHG or antibacterial soaps. If your skin becomes reddened/irritated stop using the CHG.  Do not shave (including legs and underarms) for at least 48 hours prior to first CHG shower. It is OK to shave your face.  Please follow these instructions carefully.   1. Shower the NIGHT BEFORE SURGERY and the MORNING OF SURGERY with CHG Soap.   2. If you chose to wash your hair, wash your hair first as usual with your normal shampoo.  3. After you shampoo, rinse your hair and body thoroughly to remove the shampoo.  4. Use CHG as you would any other liquid soap. You can apply CHG directly to the skin and wash gently with a scrungie or a clean washcloth.   5. Apply the CHG Soap to  your body ONLY FROM THE NECK DOWN.  Do not use on open wounds or open sores. Avoid contact with your eyes, ears, mouth and genitals (private parts). Wash Face and genitals (private parts)  with your normal soap.   6. Wash thoroughly, paying special attention to the area where your surgery will be performed.  7. Thoroughly rinse your body with warm water from the neck down.  8. DO NOT shower/wash with your normal soap after using and rinsing off the CHG Soap.  9. Pat yourself dry with a CLEAN TOWEL.  10. Wear CLEAN PAJAMAS to bed the night before surgery, wear comfortable clothes the morning of surgery  11. Place CLEAN SHEETS on your bed the night of your first shower and DO NOT SLEEP WITH PETS.   Day of Surgery:   Do not apply any deodorants/lotions.  Please wear clean clothes to the hospital/surgery center.    Remember to brush your teeth WITH YOUR REGULAR TOOTHPASTE.   Please read over the following fact sheets that you were given.

## 2019-07-12 ENCOUNTER — Other Ambulatory Visit (HOSPITAL_COMMUNITY)
Admission: RE | Admit: 2019-07-12 | Discharge: 2019-07-12 | Disposition: A | Discharge status: Home / Self Care | Payer: Medicaid Other | Source: Ambulatory Visit | Attending: Neurosurgery | Admitting: Neurosurgery

## 2019-07-12 ENCOUNTER — Encounter (HOSPITAL_COMMUNITY)
Admission: RE | Admit: 2019-07-12 | Discharge: 2019-07-12 | Disposition: A | Discharge status: Home / Self Care | Payer: Medicaid Other | Source: Ambulatory Visit | Attending: Neurosurgery | Admitting: Neurosurgery

## 2019-07-12 ENCOUNTER — Encounter (HOSPITAL_COMMUNITY): Payer: Self-pay

## 2019-07-12 ENCOUNTER — Other Ambulatory Visit: Payer: Self-pay

## 2019-07-12 DIAGNOSIS — R7303 Prediabetes: Secondary | ICD-10-CM | POA: Diagnosis not present

## 2019-07-12 DIAGNOSIS — G4733 Obstructive sleep apnea (adult) (pediatric): Secondary | ICD-10-CM | POA: Insufficient documentation

## 2019-07-12 DIAGNOSIS — Z20822 Contact with and (suspected) exposure to covid-19: Secondary | ICD-10-CM | POA: Diagnosis not present

## 2019-07-12 DIAGNOSIS — K219 Gastro-esophageal reflux disease without esophagitis: Secondary | ICD-10-CM | POA: Insufficient documentation

## 2019-07-12 DIAGNOSIS — M542 Cervicalgia: Secondary | ICD-10-CM | POA: Insufficient documentation

## 2019-07-12 DIAGNOSIS — Z79899 Other long term (current) drug therapy: Secondary | ICD-10-CM | POA: Diagnosis not present

## 2019-07-12 DIAGNOSIS — Z01818 Encounter for other preprocedural examination: Secondary | ICD-10-CM | POA: Insufficient documentation

## 2019-07-12 DIAGNOSIS — K589 Irritable bowel syndrome without diarrhea: Secondary | ICD-10-CM | POA: Diagnosis not present

## 2019-07-12 DIAGNOSIS — Z981 Arthrodesis status: Secondary | ICD-10-CM | POA: Diagnosis not present

## 2019-07-12 DIAGNOSIS — E039 Hypothyroidism, unspecified: Secondary | ICD-10-CM | POA: Insufficient documentation

## 2019-07-12 DIAGNOSIS — I10 Essential (primary) hypertension: Secondary | ICD-10-CM | POA: Diagnosis not present

## 2019-07-12 HISTORY — DX: Diverticulosis of intestine, part unspecified, without perforation or abscess without bleeding: K57.90

## 2019-07-12 HISTORY — DX: Other specified postprocedural states: Z98.890

## 2019-07-12 HISTORY — DX: Depression, unspecified: F32.A

## 2019-07-12 HISTORY — DX: Essential (primary) hypertension: I10

## 2019-07-12 HISTORY — DX: Prediabetes: R73.03

## 2019-07-12 HISTORY — DX: Irritable bowel syndrome, unspecified: K58.9

## 2019-07-12 HISTORY — DX: Other specified postprocedural states: R11.2

## 2019-07-12 HISTORY — DX: Gastro-esophageal reflux disease without esophagitis: K21.9

## 2019-07-12 HISTORY — DX: Anxiety disorder, unspecified: F41.9

## 2019-07-12 LAB — SURGICAL PCR SCREEN
MRSA, PCR: NEGATIVE
Staphylococcus aureus: POSITIVE — AB

## 2019-07-12 LAB — HEMOGLOBIN A1C
Hgb A1c MFr Bld: 6 % — ABNORMAL HIGH (ref 4.8–5.6)
Mean Plasma Glucose: 125.5 mg/dL

## 2019-07-12 LAB — CBC
HCT: 41.4 % (ref 36.0–46.0)
Hemoglobin: 13.3 g/dL (ref 12.0–15.0)
MCH: 31.2 pg (ref 26.0–34.0)
MCHC: 32.1 g/dL (ref 30.0–36.0)
MCV: 97.2 fL (ref 80.0–100.0)
Platelets: 287 10*3/uL (ref 150–400)
RBC: 4.26 MIL/uL (ref 3.87–5.11)
RDW: 12.5 % (ref 11.5–15.5)
WBC: 8.1 10*3/uL (ref 4.0–10.5)
nRBC: 0 % (ref 0.0–0.2)

## 2019-07-12 LAB — BASIC METABOLIC PANEL
Anion gap: 9 (ref 5–15)
BUN: 12 mg/dL (ref 6–20)
CO2: 28 mmol/L (ref 22–32)
Calcium: 9.4 mg/dL (ref 8.9–10.3)
Chloride: 102 mmol/L (ref 98–111)
Creatinine, Ser: 0.76 mg/dL (ref 0.44–1.00)
GFR calc Af Amer: >60 mL/min (ref >60)
GFR calc non Af Amer: >60 mL/min (ref >60)
Glucose, Bld: 108 mg/dL — ABNORMAL HIGH (ref 70–99)
Potassium: 3.1 mmol/L — ABNORMAL LOW (ref 3.5–5.1)
Sodium: 139 mmol/L (ref 135–145)

## 2019-07-12 NOTE — Progress Notes (Signed)
PCP - Dr. Tish Frederickson Cardiologist - patient denies  PPM/ICD - n/a Device Orders -  Rep Notified -   Chest x-ray - 04/19/19 EKG - 04/06/19 - tracing requested Stress Test - patient denies ECHO - patient denies Cardiac Cath - patient denies  Sleep Study - 07/02/2019; awaiting plan of care  CPAP - unknown  Fasting Blood Sugar - n/a Checks Blood Sugar _____ times a day  Blood Thinner Instructions:n/a Aspirin Instructions: n/a  ERAS Protcol - n/a PRE-SURGERY Ensure or G2-   COVID TEST- scheduled after PAT appointment   Anesthesia review: yes, abnormal EKG, tracing requested  Patient denies shortness of breath, fever, cough and chest pain at PAT appointment   All instructions explained to the patient, with a verbal understanding of the material. Patient agrees to go over the instructions while at home for a better understanding. Patient also instructed to self quarantine after being tested for COVID-19. The opportunity to ask questions was provided.

## 2019-07-13 LAB — SARS CORONAVIRUS 2 (TAT 6-24 HRS): SARS Coronavirus 2: NEGATIVE

## 2019-07-13 NOTE — Progress Notes (Signed)
Anesthesia Chart Review:  Case: [id number] Date/Time: 07/16/19 0940   Procedure: ACDF - C7-T1 left sided approach (Left ) - 3C   Anesthesia type: General   Pre-op diagnosis: Cervicalgia   Location: [facility] OR ROOM 19 / [facility] OR   Surgeons: Kary Kos, MD      DISCUSSION: Patient is a 49 year old female scheduled for the above procedure.  History includes never smoker, postoperative N/V (Scopolamine helpful in the past), migraines, vertigo, brain tumor (with seizures, s/p removal pituitary tumor ~ 1999, pathology "benign"), HTN, pre-diabetes, GERD, IBS, laparoscopic hysterectomy (for AUB, [id number], complicated by post-operative ileus/partial bowel obstruction, s/p exploratory lap with LOA 03/09/19), C-spine surgery (2020, evidence of C3-7 ACDF on MRI), hypothyroidism.  She had mild OSA on 06/2019 sleep study at [facility]. She is awaiting any recommendations regarding if she will or will not need CPAP.   She underwent multiple surgeries at [facility] earlier this year as outlined above.  She denied shortness of breath, cough, fever, chest pain at PAT RN visit. Presurgical COVID-19 test negative on 07/12/2019.  Anesthesia team to evaluate on the day of surgery.   VS: BP (!) 118/56   Pulse 75   Temp 37 C (Oral)   Resp 18   Ht 5\' 5"  (1.651 m)   Wt 81.6 kg   LMP 03/29/2017   SpO2 100%   BMI 29.94 kg/m   PROVIDERS: Kateri Mc, MD his PCP   LABS: Labs reviewed: Acceptable for surgery. (all labs ordered are listed, but only abnormal results are displayed)  Labs Reviewed  SURGICAL PCR SCREEN - Abnormal; Notable for the following components:      Result Value   Staphylococcus aureus POSITIVE (*)    All other components within normal limits  BASIC METABOLIC PANEL - Abnormal; Notable for the following components:   Potassium 3.1 (*)    Glucose, Bld 108 (*)    All other components within normal limits  HEMOGLOBIN A1C - Abnormal; Notable for the following components:   Hgb A1c MFr Bld 6.0  (*)    All other components within normal limits  CBC    Sleep Study 07/04/19 ([facility]): CLINICAL INTERPRETATION: The nocturnal polysomnogram shows mild obstructive sleep apnea with an apneahypopnea index of 11.4 events per hour of sleep and a minimum oxygen saturation of 85%. The average oxygen saturation was 93%.   IMAGES: MRI C-spine 04/09/19: IMPRESSION: - ACDF C3 through C7 with good decompression of the spinal canal. Mild right foraminal narrowing at C3-4 - Broad-based disc protrusion C7-T1 has progressed with mild spinal and foraminal stenosis bilaterally.   EKG: 03/19/19 [facility]): Normal sinus rhythm.  Cannot rule out anterior infarct, age undetermined.   CV: Denied prior stress test, echocardiogram or cardiac cath.  Past Medical History:  Diagnosis Date  . Anxiety   . Brain tumor (benign) ([place])   . Chronic right shoulder pain   . Depression   . Diverticulitis   . Diverticulosis   . GERD (gastroesophageal reflux disease)   . Hypertension   . IBS (irritable bowel syndrome)   . Ileus, postoperative ([place]) 02/2019  . Migraines   . Partial bowel obstruction ([place]) 02/2019  . PONV (postoperative nausea and vomiting)    "Scopolamine patch helps a lot" per patient  . Pre-diabetes   . Seizures ([place]) 1990   07/12/2019 - "due to brain tumor, which has since been removed and no seizures since 1999"  . Thyroid disease   . Vertigo  Past Surgical History:  Procedure Laterality Date  . ABDOMINAL HYSTERECTOMY    . BELPHAROPTOSIS REPAIR Left   . BRAIN SURGERY    . CERVICAL SPINE SURGERY  2020  . COLONOSCOPY WITH ESOPHAGOGASTRODUODENOSCOPY (EGD)    . COSMETIC SURGERY     Facial surgery due to dog bite  . ENDOMETRIAL ABLATION    . LAPAROSCOPIC LYSIS OF ADHESIONS  03/09/2019   with release of small bowel obstruction  . NASAL SINUS SURGERY    . OVARIAN CYST REMOVAL Right     MEDICATIONS: . baclofen (LIORESAL) 10 MG tablet  . cholecalciferol (VITAMIN D3) 25  MCG (1000 UNIT) tablet  . diclofenac (VOLTAREN) 75 MG EC tablet  . estradiol (ESTRACE) 1 MG tablet  . hydrochlorothiazide (HYDRODIURIL) 25 MG tablet  . meclizine (ANTIVERT) 12.5 MG tablet  . montelukast (SINGULAIR) 10 MG tablet  . ondansetron (ZOFRAN ODT) 4 MG disintegrating tablet  . pantoprazole (PROTONIX) 40 MG tablet  . traMADol (ULTRAM) 50 MG tablet   No current facility-administered medications for this encounter.     Myra Gianotti, PA-C Surgical Short Stay/Anesthesiology [facility] Phone [phone] [facility] Phone [phone] 07/13/2019 12:22 PM

## 2019-07-13 NOTE — Anesthesia Preprocedure Evaluation (Addendum)
Anesthesia Evaluation  Patient identified by MRN, date of birth, ID band Patient awake    Reviewed: Allergy & Precautions, NPO status , Patient's Chart, lab work & pertinent test results  History of Anesthesia Complications (+) PONV  Airway Mallampati: II  TM Distance: >3 FB Neck ROM: Full    Dental no notable dental hx.    Pulmonary neg pulmonary ROS,    Pulmonary exam normal breath sounds clear to auscultation       Cardiovascular hypertension, Normal cardiovascular exam Rhythm:Regular Rate:Normal     Neuro/Psych negative neurological ROS  negative psych ROS   GI/Hepatic negative GI ROS, Neg liver ROS,   Endo/Other  Hypothyroidism   Renal/GU negative Renal ROS  negative genitourinary   Musculoskeletal negative musculoskeletal ROS (+)   Abdominal   Peds negative pediatric ROS (+)  Hematology negative hematology ROS (+)   Anesthesia Other Findings   Reproductive/Obstetrics negative OB ROS                             Anesthesia Physical Anesthesia Plan  ASA: II  Anesthesia Plan: General   Post-op Pain Management:    Induction: Intravenous  PONV Risk Score and Plan: 4 or greater and Ondansetron, Dexamethasone, Midazolam, Scopolamine patch - Pre-op and Treatment may vary due to age or medical condition  Airway Management Planned: Oral ETT  Additional Equipment:   Intra-op Plan:   Post-operative Plan: Extubation in OR  Informed Consent: I have reviewed the patients History and Physical, chart, labs and discussed the procedure including the risks, benefits and alternatives for the proposed anesthesia with the patient or authorized representative who has indicated his/her understanding and acceptance.     Dental advisory given  Plan Discussed with: CRNA and Surgeon  Anesthesia Plan Comments: (PAT note written 07/13/2019 by Myra Gianotti, PA-C. )       Anesthesia  Quick Evaluation

## 2019-07-16 ENCOUNTER — Ambulatory Visit (HOSPITAL_COMMUNITY): Payer: Medicaid Other

## 2019-07-16 ENCOUNTER — Ambulatory Visit (HOSPITAL_COMMUNITY): Payer: Medicaid Other | Admitting: Certified Registered Nurse Anesthetist

## 2019-07-16 ENCOUNTER — Other Ambulatory Visit: Payer: Self-pay

## 2019-07-16 ENCOUNTER — Ambulatory Visit (HOSPITAL_COMMUNITY): Payer: Medicaid Other | Admitting: Vascular Surgery

## 2019-07-16 ENCOUNTER — Ambulatory Visit (HOSPITAL_COMMUNITY)
Admission: RE | Admit: 2019-07-16 | Discharge: 2019-07-17 | Disposition: A | Discharge status: Home / Self Care | Payer: Medicaid Other | Attending: Neurosurgery | Admitting: Neurosurgery

## 2019-07-16 ENCOUNTER — Encounter (HOSPITAL_COMMUNITY)
Admission: RE | Disposition: A | Discharge status: Home / Self Care | Payer: Self-pay | Source: Home / Self Care | Attending: Neurosurgery

## 2019-07-16 ENCOUNTER — Encounter (HOSPITAL_COMMUNITY): Payer: Self-pay | Admitting: Neurosurgery

## 2019-07-16 DIAGNOSIS — Z90710 Acquired absence of both cervix and uterus: Secondary | ICD-10-CM | POA: Insufficient documentation

## 2019-07-16 DIAGNOSIS — R7303 Prediabetes: Secondary | ICD-10-CM | POA: Diagnosis not present

## 2019-07-16 DIAGNOSIS — Z888 Allergy status to other drugs, medicaments and biological substances status: Secondary | ICD-10-CM | POA: Insufficient documentation

## 2019-07-16 DIAGNOSIS — F419 Anxiety disorder, unspecified: Secondary | ICD-10-CM | POA: Insufficient documentation

## 2019-07-16 DIAGNOSIS — I10 Essential (primary) hypertension: Secondary | ICD-10-CM | POA: Insufficient documentation

## 2019-07-16 DIAGNOSIS — M5023 Other cervical disc displacement, cervicothoracic region: Secondary | ICD-10-CM | POA: Insufficient documentation

## 2019-07-16 DIAGNOSIS — G43909 Migraine, unspecified, not intractable, without status migrainosus: Secondary | ICD-10-CM | POA: Diagnosis not present

## 2019-07-16 DIAGNOSIS — F329 Major depressive disorder, single episode, unspecified: Secondary | ICD-10-CM | POA: Insufficient documentation

## 2019-07-16 DIAGNOSIS — Z79899 Other long term (current) drug therapy: Secondary | ICD-10-CM | POA: Insufficient documentation

## 2019-07-16 DIAGNOSIS — Z791 Long term (current) use of non-steroidal anti-inflammatories (NSAID): Secondary | ICD-10-CM | POA: Diagnosis not present

## 2019-07-16 DIAGNOSIS — K219 Gastro-esophageal reflux disease without esophagitis: Secondary | ICD-10-CM | POA: Diagnosis not present

## 2019-07-16 DIAGNOSIS — E039 Hypothyroidism, unspecified: Secondary | ICD-10-CM | POA: Insufficient documentation

## 2019-07-16 DIAGNOSIS — M4723 Other spondylosis with radiculopathy, cervicothoracic region: Secondary | ICD-10-CM | POA: Diagnosis not present

## 2019-07-16 DIAGNOSIS — K589 Irritable bowel syndrome without diarrhea: Secondary | ICD-10-CM | POA: Insufficient documentation

## 2019-07-16 DIAGNOSIS — Z885 Allergy status to narcotic agent status: Secondary | ICD-10-CM | POA: Insufficient documentation

## 2019-07-16 DIAGNOSIS — Z419 Encounter for procedure for purposes other than remedying health state, unspecified: Secondary | ICD-10-CM

## 2019-07-16 HISTORY — PX: ANTERIOR CERVICAL DECOMP/DISCECTOMY FUSION: SHX1161

## 2019-07-16 LAB — GLUCOSE, CAPILLARY
Glucose-Capillary: 107 mg/dL — ABNORMAL HIGH (ref 70–99)
Glucose-Capillary: 134 mg/dL — ABNORMAL HIGH (ref 70–99)

## 2019-07-16 IMAGING — RF DG CERVICAL SPINE 2 OR 3 VIEWS
1 series · 2 of 2 positions shown · non-contrast
Comparison: Radiographs [DATE]. MRI [DATE].

CLINICAL DATA: ACDF.

EXAM:
CERVICAL SPINE - 2-3 VIEW

[Series 1: run · 2 of 2 slices shown]
[im 1/2]
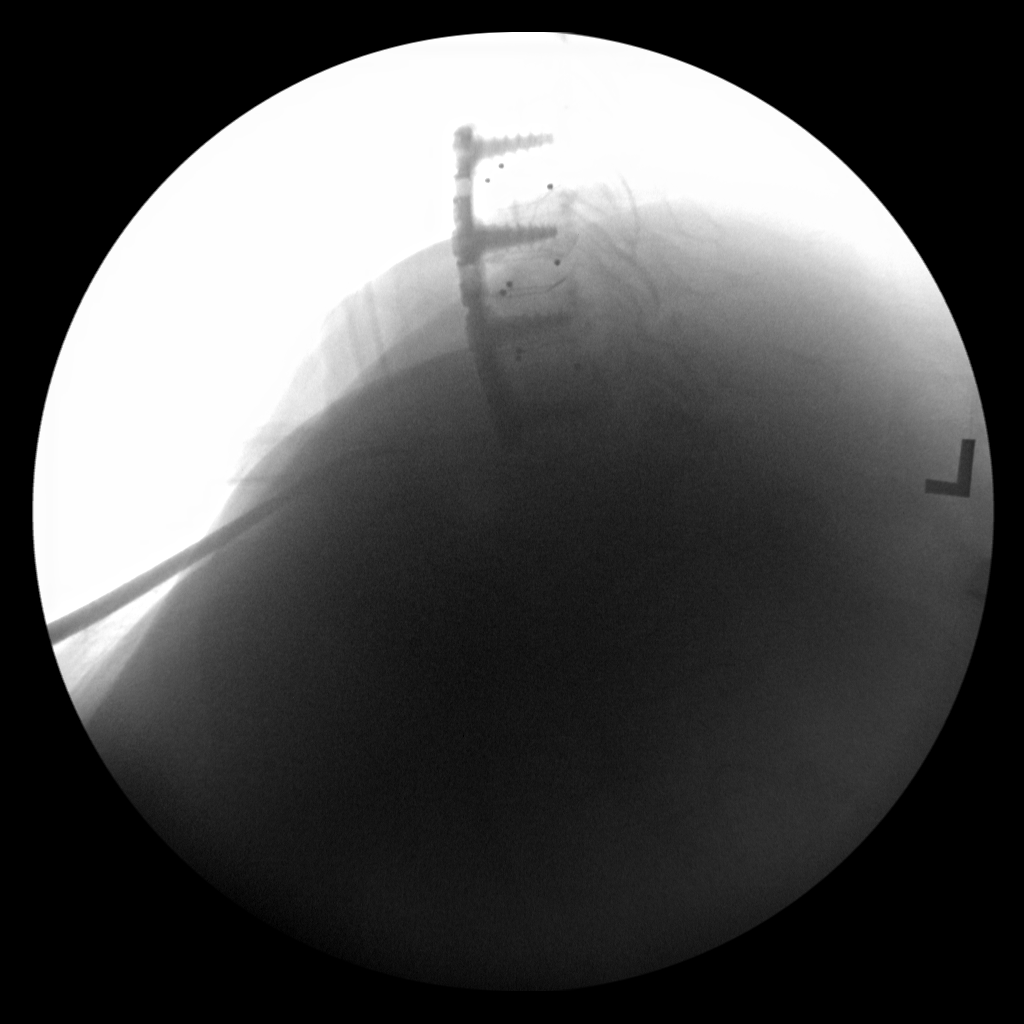
[im 2/2]
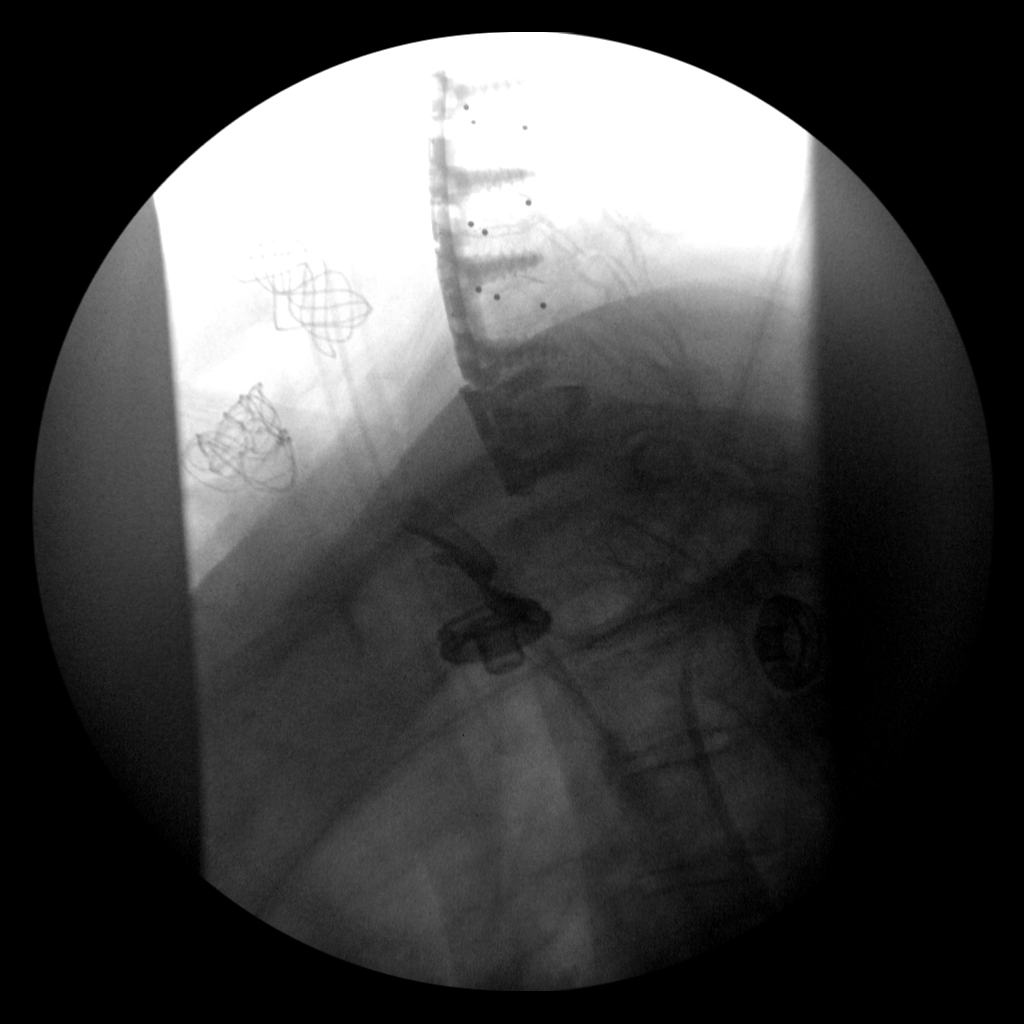

[2 of 2 positions shown; findings below may reference images not displayed]

FINDINGS: C-arm fluoroscopy was provided in the operating [HOSPITAL] seconds of
fluoroscopy time.

2 lateral spot fluoroscopic images are submitted from the operating
room. Pre-existing ACDF extending from C4 through C7 with an
anterior plate, screws and intervertebral bone plugs. Initial image
demonstrates anterior localization of the C7-T1 disc space level.
Subsequent images demonstrate performance of anterior discectomy and
fusion at C7-T1 with a new plate, screws and intervertebral bone
plugs. Surgical sponges are present anteriorly in the surgical bed.
IMPRESSION: Intraoperative views during C7-T1 ACDF. Pre-existing ACDF from C4
through C7.

## 2019-07-16 IMAGING — RF DG C-ARM 1-60 MIN
1 series · 2 of 2 positions shown · non-contrast
Comparison: None.

CLINICAL DATA: Anterior fusion

EXAM:
DG C-ARM 1-60 MIN
FLUOROSCOPY TIME:  Fluoroscopy Time: 0 minutes 6 seconds
Number of Acquired Spot Images: 2

[Series 1: run · 2 of 2 slices shown]
[im 1/2]
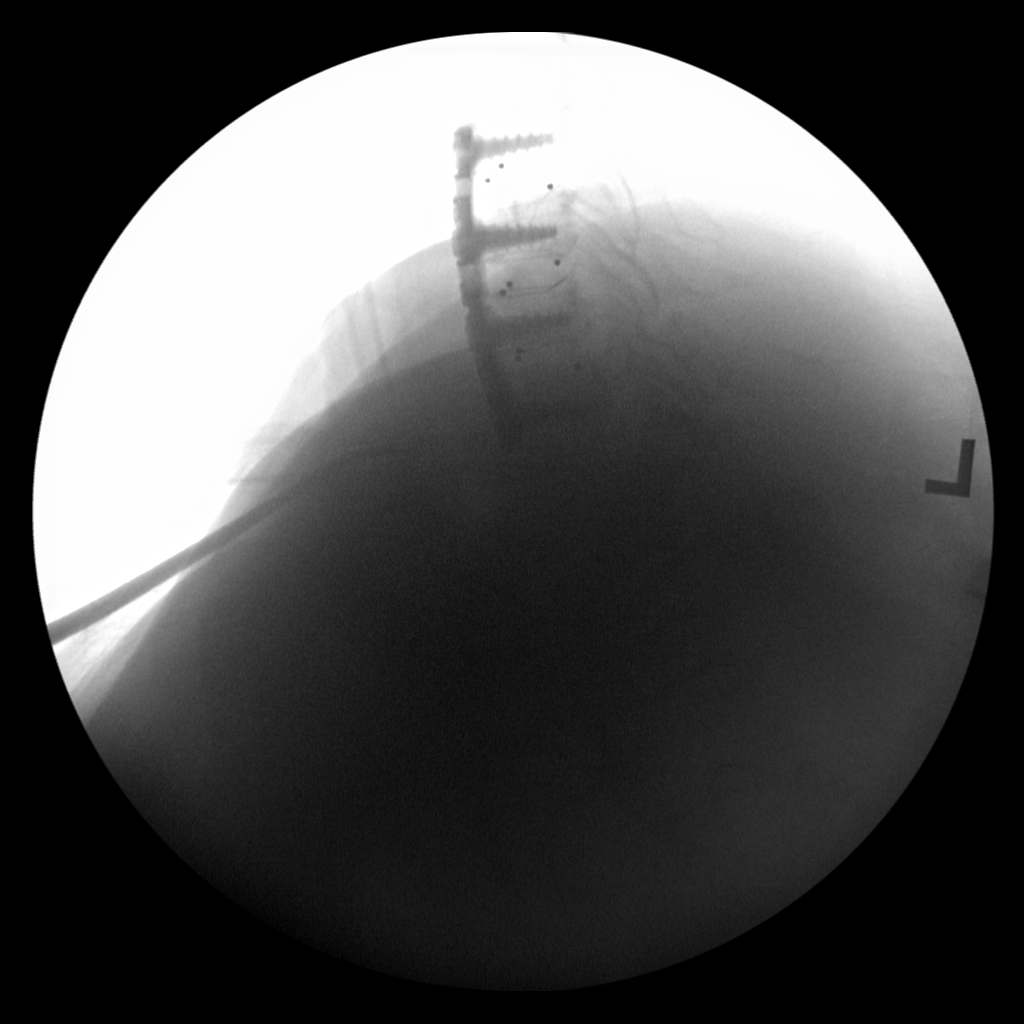
[im 2/2]
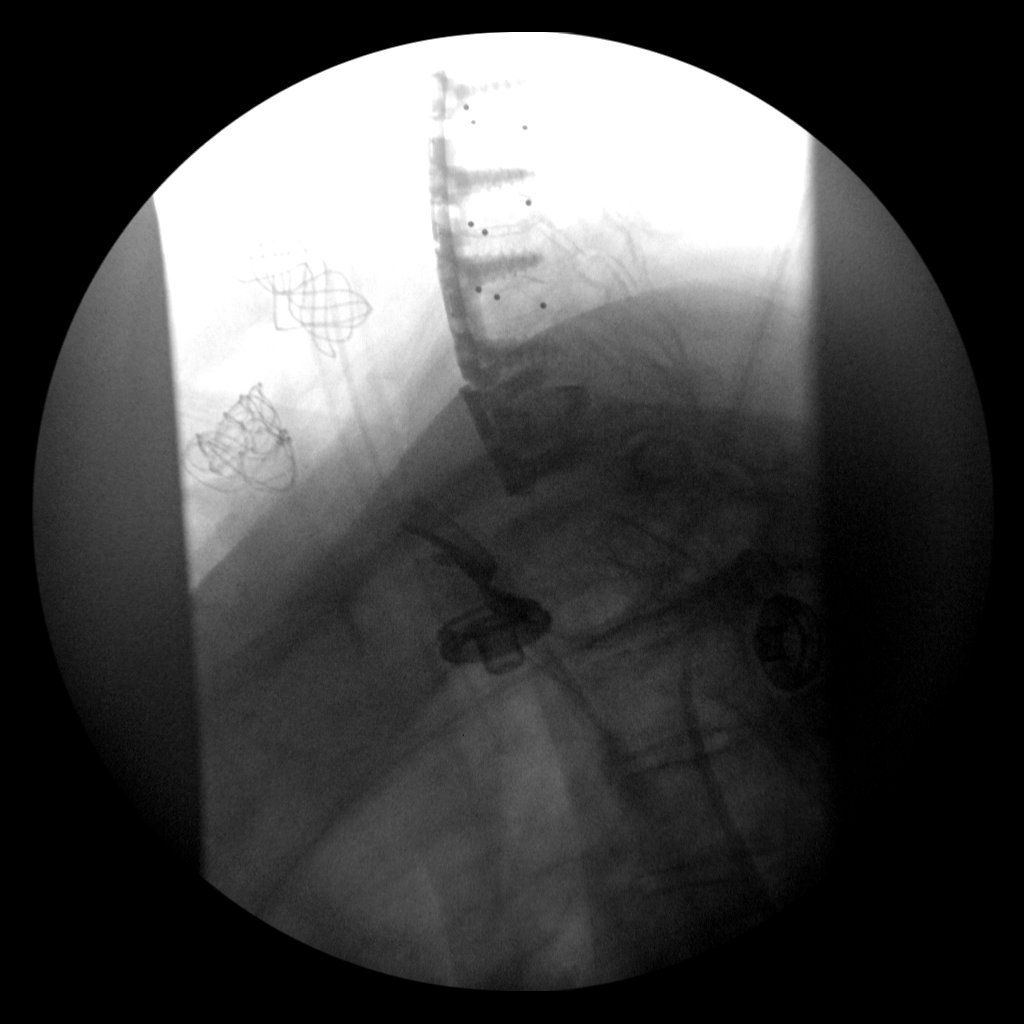

[2 of 2 positions shown; findings below may reference images not displayed]

FINDINGS: Intraoperative lateral view shows multilevel anterior screw and
plate fixation with support hardware intact. Exact levels cannot be
determined on submitted images due to underexposure of the bony
structures.
IMPRESSION: Multilevel fixation with support hardware intact. Exact levels
cannot be determined on underexposed intraoperative images.

## 2019-07-16 SURGERY — ANTERIOR CERVICAL DECOMPRESSION/DISCECTOMY FUSION 1 LEVEL
Anesthesia: General | Site: Spine Cervical | Laterality: Left

## 2019-07-16 MED ORDER — ROCURONIUM BROMIDE 10 MG/ML (PF) SYRINGE
PREFILLED_SYRINGE | INTRAVENOUS | Status: AC
  Filled 2019-07-16: qty 10

## 2019-07-16 MED ORDER — PANTOPRAZOLE SODIUM 40 MG IV SOLR
40.0000 mg | Freq: Every day | INTRAVENOUS | Status: DC
Start: 2019-07-16 — End: 2019-07-16

## 2019-07-16 MED ORDER — ORAL CARE MOUTH RINSE: 15.0000 mL | Freq: Once | OROMUCOSAL | Status: AC

## 2019-07-16 MED ORDER — EPHEDRINE SULFATE-NACL 50-0.9 MG/10ML-% IV SOSY
PREFILLED_SYRINGE | INTRAVENOUS | Status: DC | PRN
  Administered 2019-07-16: 5 mg via INTRAVENOUS

## 2019-07-16 MED ORDER — ONDANSETRON HCL 4 MG/2ML IJ SOLN
INTRAMUSCULAR | Status: DC | PRN
  Administered 2019-07-16: 4 mg via INTRAVENOUS

## 2019-07-16 MED ORDER — HEMOSTATIC AGENTS (NO CHARGE) OPTIME
TOPICAL | Status: DC | PRN
Start: 2019-07-16 — End: 2019-07-16
  Administered 2019-07-16: 1 via TOPICAL

## 2019-07-16 MED ORDER — LIDOCAINE 2% (20 MG/ML) 5 ML SYRINGE
INTRAMUSCULAR | Status: DC | PRN
  Administered 2019-07-16: 100 mg via INTRAVENOUS

## 2019-07-16 MED ORDER — ESTRADIOL 1 MG PO TABS
1.0000 mg | ORAL_TABLET | Freq: Every day | ORAL | Status: DC
  Filled 2019-07-16: qty 1

## 2019-07-16 MED ORDER — HYDROMORPHONE HCL 1 MG/ML IJ SOLN: 0.2500 mg | INTRAMUSCULAR | Status: DC | PRN

## 2019-07-16 MED ORDER — OXYCODONE HCL 5 MG PO TABS
5.0000 mg | ORAL_TABLET | ORAL | Status: DC | PRN
  Administered 2019-07-16 – 2019-07-17 (×4): 5 mg via ORAL
  Filled 2019-07-16 (×4): qty 1

## 2019-07-16 MED ORDER — CHLORHEXIDINE GLUCONATE CLOTH 2 % EX PADS: 6.0000 | MEDICATED_PAD | Freq: Once | CUTANEOUS | Status: DC

## 2019-07-16 MED ORDER — CEFAZOLIN SODIUM-DEXTROSE 2-4 GM/100ML-% IV SOLN
2.0000 g | INTRAVENOUS | Status: AC
  Administered 2019-07-16: 2 g via INTRAVENOUS
  Filled 2019-07-16: qty 100

## 2019-07-16 MED ORDER — MECLIZINE HCL 12.5 MG PO TABS
12.5000 mg | ORAL_TABLET | Freq: Three times a day (TID) | ORAL | Status: DC | PRN
  Filled 2019-07-16: qty 1

## 2019-07-16 MED ORDER — LIDOCAINE 2% (20 MG/ML) 5 ML SYRINGE
INTRAMUSCULAR | Status: AC
  Filled 2019-07-16: qty 5

## 2019-07-16 MED ORDER — SODIUM CHLORIDE 0.9% FLUSH: 3.0000 mL | INTRAVENOUS | Status: DC | PRN

## 2019-07-16 MED ORDER — MENTHOL 3 MG MT LOZG
1.0000 | LOZENGE | OROMUCOSAL | Status: DC | PRN
  Administered 2019-07-16: 3 mg via ORAL
  Filled 2019-07-16: qty 9

## 2019-07-16 MED ORDER — PHENYLEPHRINE 40 MCG/ML (10ML) SYRINGE FOR IV PUSH (FOR BLOOD PRESSURE SUPPORT)
PREFILLED_SYRINGE | INTRAVENOUS | Status: AC
  Filled 2019-07-16: qty 10

## 2019-07-16 MED ORDER — THROMBIN 5000 UNITS EX SOLR
CUTANEOUS | Status: AC
  Filled 2019-07-16: qty 15000

## 2019-07-16 MED ORDER — SODIUM CHLORIDE 0.9% FLUSH
3.0000 mL | Freq: Two times a day (BID) | INTRAVENOUS | Status: DC
  Administered 2019-07-16: 3 mL via INTRAVENOUS

## 2019-07-16 MED ORDER — MIDAZOLAM HCL 2 MG/2ML IJ SOLN
INTRAMUSCULAR | Status: AC
  Filled 2019-07-16: qty 2

## 2019-07-16 MED ORDER — PROPOFOL 10 MG/ML IV BOLUS
INTRAVENOUS | Status: AC
  Filled 2019-07-16: qty 20

## 2019-07-16 MED ORDER — HYDROCHLOROTHIAZIDE 25 MG PO TABS
25.0000 mg | ORAL_TABLET | Freq: Every morning | ORAL | Status: DC
  Administered 2019-07-16: 25 mg via ORAL
  Filled 2019-07-16: qty 1

## 2019-07-16 MED ORDER — PHENYLEPHRINE HCL-NACL 10-0.9 MG/250ML-% IV SOLN
INTRAVENOUS | Status: DC | PRN
  Administered 2019-07-16: 50 ug/min via INTRAVENOUS

## 2019-07-16 MED ORDER — CYCLOBENZAPRINE HCL 10 MG PO TABS
10.0000 mg | ORAL_TABLET | Freq: Three times a day (TID) | ORAL | Status: DC | PRN
  Administered 2019-07-16 – 2019-07-17 (×2): 10 mg via ORAL
  Filled 2019-07-16 (×2): qty 1

## 2019-07-16 MED ORDER — ONDANSETRON HCL 4 MG/2ML IJ SOLN
INTRAMUSCULAR | Status: AC
  Filled 2019-07-16: qty 2

## 2019-07-16 MED ORDER — ONDANSETRON HCL 4 MG PO TABS
4.0000 mg | ORAL_TABLET | Freq: Four times a day (QID) | ORAL | Status: DC | PRN
  Administered 2019-07-17: 4 mg via ORAL
  Filled 2019-07-16: qty 1

## 2019-07-16 MED ORDER — CEFAZOLIN SODIUM-DEXTROSE 2-4 GM/100ML-% IV SOLN
2.0000 g | Freq: Three times a day (TID) | INTRAVENOUS | Status: AC
  Administered 2019-07-16 – 2019-07-17 (×2): 2 g via INTRAVENOUS
  Filled 2019-07-16 (×2): qty 100

## 2019-07-16 MED ORDER — ACETAMINOPHEN 650 MG RE SUPP: 650.0000 mg | RECTAL | Status: DC | PRN

## 2019-07-16 MED ORDER — HYDROMORPHONE HCL 1 MG/ML IJ SOLN: 0.5000 mg | INTRAMUSCULAR | Status: DC | PRN

## 2019-07-16 MED ORDER — PHENOL 1.4 % MT LIQD: 1.0000 | OROMUCOSAL | Status: DC | PRN

## 2019-07-16 MED ORDER — MIDAZOLAM HCL 5 MG/5ML IJ SOLN
INTRAMUSCULAR | Status: DC | PRN
  Administered 2019-07-16: 2 mg via INTRAVENOUS

## 2019-07-16 MED ORDER — ROCURONIUM BROMIDE 100 MG/10ML IV SOLN
INTRAVENOUS | Status: DC | PRN
  Administered 2019-07-16: 70 mg via INTRAVENOUS

## 2019-07-16 MED ORDER — SCOPOLAMINE 1 MG/3DAYS TD PT72
MEDICATED_PATCH | TRANSDERMAL | Status: AC
  Filled 2019-07-16: qty 1

## 2019-07-16 MED ORDER — VITAMIN D 25 MCG (1000 UNIT) PO TABS: 1000.0000 [IU] | ORAL_TABLET | Freq: Every day | ORAL | Status: DC

## 2019-07-16 MED ORDER — CHLORHEXIDINE GLUCONATE 0.12 % MT SOLN
15.0000 mL | Freq: Once | OROMUCOSAL | Status: AC
  Administered 2019-07-16: 15 mL via OROMUCOSAL
  Filled 2019-07-16: qty 15

## 2019-07-16 MED ORDER — ACETAMINOPHEN 325 MG PO TABS: 650.0000 mg | ORAL_TABLET | ORAL | Status: DC | PRN

## 2019-07-16 MED ORDER — 0.9 % SODIUM CHLORIDE (POUR BTL) OPTIME
TOPICAL | Status: DC | PRN
  Administered 2019-07-16: 1000 mL

## 2019-07-16 MED ORDER — SODIUM CHLORIDE 0.9 % IV SOLN
INTRAVENOUS | Status: DC | PRN
  Administered 2019-07-16: 500 mL

## 2019-07-16 MED ORDER — ALUM & MAG HYDROXIDE-SIMETH 200-200-20 MG/5ML PO SUSP: 30.0000 mL | Freq: Four times a day (QID) | ORAL | Status: DC | PRN

## 2019-07-16 MED ORDER — MAGNESIUM CITRATE PO SOLN
1.0000 | Freq: Once | ORAL | Status: AC
  Administered 2019-07-16: 1 via ORAL
  Filled 2019-07-16: qty 296

## 2019-07-16 MED ORDER — DEXAMETHASONE SODIUM PHOSPHATE 10 MG/ML IJ SOLN
INTRAMUSCULAR | Status: AC
  Filled 2019-07-16: qty 1

## 2019-07-16 MED ORDER — PROPOFOL 10 MG/ML IV BOLUS
INTRAVENOUS | Status: DC | PRN
  Administered 2019-07-16: 50 mg via INTRAVENOUS

## 2019-07-16 MED ORDER — AMISULPRIDE (ANTIEMETIC) 5 MG/2ML IV SOLN
10.0000 mg | Freq: Once | INTRAVENOUS | Status: AC
  Administered 2019-07-16: 10 mg via INTRAVENOUS

## 2019-07-16 MED ORDER — SUGAMMADEX SODIUM 200 MG/2ML IV SOLN
INTRAVENOUS | Status: DC | PRN
  Administered 2019-07-16: 200 mg via INTRAVENOUS

## 2019-07-16 MED ORDER — ONDANSETRON HCL 4 MG/2ML IJ SOLN: 4.0000 mg | Freq: Four times a day (QID) | INTRAMUSCULAR | Status: DC | PRN

## 2019-07-16 MED ORDER — FENTANYL CITRATE (PF) 250 MCG/5ML IJ SOLN
INTRAMUSCULAR | Status: DC | PRN
  Administered 2019-07-16: 50 ug via INTRAVENOUS
  Administered 2019-07-16: 100 ug via INTRAVENOUS
  Administered 2019-07-16: 50 ug via INTRAVENOUS
  Administered 2019-07-16: 25 ug via INTRAVENOUS

## 2019-07-16 MED ORDER — THROMBIN 5000 UNITS EX SOLR
CUTANEOUS | Status: DC | PRN
  Administered 2019-07-16 (×2): 5000 [IU] via TOPICAL

## 2019-07-16 MED ORDER — SODIUM CHLORIDE 0.9 % IV SOLN: 250.0000 mL | INTRAVENOUS | Status: DC

## 2019-07-16 MED ORDER — DEXAMETHASONE SODIUM PHOSPHATE 10 MG/ML IJ SOLN
10.0000 mg | Freq: Once | INTRAMUSCULAR | Status: DC
  Filled 2019-07-16: qty 1

## 2019-07-16 MED ORDER — FENTANYL CITRATE (PF) 250 MCG/5ML IJ SOLN
INTRAMUSCULAR | Status: AC
  Filled 2019-07-16: qty 5

## 2019-07-16 MED ORDER — THROMBIN 5000 UNITS EX SOLR
OROMUCOSAL | Status: DC | PRN
  Administered 2019-07-16: 5 mL via TOPICAL

## 2019-07-16 MED ORDER — MONTELUKAST SODIUM 10 MG PO TABS
10.0000 mg | ORAL_TABLET | Freq: Every evening | ORAL | Status: DC | PRN
  Filled 2019-07-16: qty 1

## 2019-07-16 MED ORDER — HYDROMORPHONE HCL 1 MG/ML IJ SOLN
INTRAMUSCULAR | Status: AC
  Filled 2019-07-16: qty 1

## 2019-07-16 MED ORDER — HYDROXYZINE HCL 50 MG/ML IM SOLN
50.0000 mg | Freq: Four times a day (QID) | INTRAMUSCULAR | Status: DC | PRN
  Filled 2019-07-16: qty 1

## 2019-07-16 MED ORDER — LACTATED RINGERS IV SOLN: INTRAVENOUS | Status: DC

## 2019-07-16 MED ORDER — PHENYLEPHRINE 40 MCG/ML (10ML) SYRINGE FOR IV PUSH (FOR BLOOD PRESSURE SUPPORT)
PREFILLED_SYRINGE | INTRAVENOUS | Status: DC | PRN
  Administered 2019-07-16: 40 ug via INTRAVENOUS
  Administered 2019-07-16 (×2): 80 ug via INTRAVENOUS
  Administered 2019-07-16: 40 ug via INTRAVENOUS

## 2019-07-16 MED ORDER — AMISULPRIDE (ANTIEMETIC) 5 MG/2ML IV SOLN
INTRAVENOUS | Status: AC
  Filled 2019-07-16: qty 4

## 2019-07-16 MED ORDER — DEXAMETHASONE SODIUM PHOSPHATE 10 MG/ML IJ SOLN
INTRAMUSCULAR | Status: DC | PRN
  Administered 2019-07-16: 10 mg via INTRAVENOUS

## 2019-07-16 MED ORDER — PANTOPRAZOLE SODIUM 40 MG PO TBEC: 40.0000 mg | DELAYED_RELEASE_TABLET | Freq: Every day | ORAL | Status: DC

## 2019-07-16 MED ORDER — EPHEDRINE 5 MG/ML INJ
INTRAVENOUS | Status: AC
  Filled 2019-07-16: qty 10

## 2019-07-16 MED ORDER — SCOPOLAMINE 1 MG/3DAYS TD PT72
MEDICATED_PATCH | TRANSDERMAL | Status: DC | PRN
  Administered 2019-07-16: 1 via TRANSDERMAL

## 2019-07-16 SURGICAL SUPPLY — 63 items
BAG DECANTER FOR FLEXI CONT (MISCELLANEOUS) ×3 IMPLANT
BAND RUBBER #18 3X1/16 STRL (MISCELLANEOUS) ×6 IMPLANT
BASKET BONE COLLECTION (BASKET) ×3 IMPLANT
BENZOIN TINCTURE PRP APPL 2/3 (GAUZE/BANDAGES/DRESSINGS) ×3 IMPLANT
BIT DRILL NEURO 2X3.1 SFT TUCH (MISCELLANEOUS) ×1 IMPLANT
BONE VIVIGEN FORMABLE 1.3CC (Bone Implant) ×3 IMPLANT
BUR MATCHSTICK NEURO 3.0 LAGG (BURR) ×3 IMPLANT
CANISTER SUCT 3000ML PPV (MISCELLANEOUS) ×3 IMPLANT
CARTRIDGE OIL MAESTRO DRILL (MISCELLANEOUS) ×1 IMPLANT
CLOSURE WOUND 1/2 X4 (GAUZE/BANDAGES/DRESSINGS) ×1
COVER WAND RF STERILE (DRAPES) ×1 IMPLANT
DERMABOND ADVANCED (GAUZE/BANDAGES/DRESSINGS) ×2
DERMABOND ADVANCED .7 DNX12 (GAUZE/BANDAGES/DRESSINGS) IMPLANT
DIFFUSER DRILL AIR PNEUMATIC (MISCELLANEOUS) ×3 IMPLANT
DRAPE C-ARM 42X72 X-RAY (DRAPES) ×6 IMPLANT
DRAPE LAPAROTOMY 100X72 PEDS (DRAPES) ×3 IMPLANT
DRAPE MICROSCOPE LEICA (MISCELLANEOUS) ×3 IMPLANT
DRILL NEURO 2X3.1 SOFT TOUCH (MISCELLANEOUS) ×3
DRSG OPSITE POSTOP 4X6 (GAUZE/BANDAGES/DRESSINGS) ×2 IMPLANT
DURAPREP 6ML APPLICATOR 50/CS (WOUND CARE) ×3 IMPLANT
ELECT COATED BLADE 2.86 ST (ELECTRODE) ×3 IMPLANT
ELECT REM PT RETURN 9FT ADLT (ELECTROSURGICAL) ×3
ELECTRODE REM PT RTRN 9FT ADLT (ELECTROSURGICAL) ×1 IMPLANT
GAUZE 4X4 16PLY RFD (DISPOSABLE) IMPLANT
GAUZE SPONGE 4X4 12PLY STRL (GAUZE/BANDAGES/DRESSINGS) ×1 IMPLANT
GLOVE BIO SURGEON STRL SZ7 (GLOVE) ×2 IMPLANT
GLOVE BIO SURGEON STRL SZ8 (GLOVE) ×3 IMPLANT
GLOVE BIOGEL PI IND STRL 7.0 (GLOVE) IMPLANT
GLOVE BIOGEL PI INDICATOR 7.0 (GLOVE) ×2
GLOVE EXAM NITRILE XL STR (GLOVE) IMPLANT
GLOVE INDICATOR 8.5 STRL (GLOVE) ×3 IMPLANT
GOWN STRL REUS W/ TWL LRG LVL3 (GOWN DISPOSABLE) ×1 IMPLANT
GOWN STRL REUS W/ TWL XL LVL3 (GOWN DISPOSABLE) ×1 IMPLANT
GOWN STRL REUS W/TWL 2XL LVL3 (GOWN DISPOSABLE) ×1 IMPLANT
GOWN STRL REUS W/TWL LRG LVL3 (GOWN DISPOSABLE) ×4
GOWN STRL REUS W/TWL XL LVL3 (GOWN DISPOSABLE) ×2
GRAFT BNE MATRIX VG FRMBL SM 1 (Bone Implant) IMPLANT
HALTER HD/CHIN CERV TRACTION D (MISCELLANEOUS) ×3 IMPLANT
HEMOSTAT POWDER KIT SURGIFOAM (HEMOSTASIS) ×3 IMPLANT
KIT BASIN OR (CUSTOM PROCEDURE TRAY) ×3 IMPLANT
KIT TURNOVER KIT B (KITS) ×3 IMPLANT
NDL HYPO 18GX1.5 BLUNT FILL (NEEDLE) ×1 IMPLANT
NDL SPNL 20GX3.5 QUINCKE YW (NEEDLE) ×1 IMPLANT
NEEDLE HYPO 18GX1.5 BLUNT FILL (NEEDLE) IMPLANT
NEEDLE SPNL 20GX3.5 QUINCKE YW (NEEDLE) ×3 IMPLANT
NS IRRIG 1000ML POUR BTL (IV SOLUTION) ×3 IMPLANT
OIL CARTRIDGE MAESTRO DRILL (MISCELLANEOUS) ×3
PACK LAMINECTOMY NEURO (CUSTOM PROCEDURE TRAY) ×3 IMPLANT
PAD ARMBOARD 7.5X6 YLW CONV (MISCELLANEOUS) ×13 IMPLANT
PIN DISTRACTION 14MM (PIN) ×4 IMPLANT
PLATE ANT RESONATE 11 1LVL (Plate) ×2 IMPLANT
SCREW VA SD RESONATE 4.2X14 (Screw) ×8 IMPLANT
SPACER HEDRON 14X16X6 0D (Spacer) ×2 IMPLANT
SPONGE INTESTINAL PEANUT (DISPOSABLE) ×3 IMPLANT
SPONGE SURGIFOAM ABS GEL SZ50 (HEMOSTASIS) ×3 IMPLANT
STRIP CLOSURE SKIN 1/2X4 (GAUZE/BANDAGES/DRESSINGS) ×2 IMPLANT
SUT VIC AB 3-0 SH 8-18 (SUTURE) ×3 IMPLANT
SUT VICRYL 4-0 PS2 18IN ABS (SUTURE) ×3 IMPLANT
TAPE CLOTH 3X10 TAN LF (GAUZE/BANDAGES/DRESSINGS) ×2 IMPLANT
TAPE CLOTH 4X10 WHT NS (GAUZE/BANDAGES/DRESSINGS) IMPLANT
TOWEL GREEN STERILE (TOWEL DISPOSABLE) ×3 IMPLANT
TOWEL GREEN STERILE FF (TOWEL DISPOSABLE) ×3 IMPLANT
WATER STERILE IRR 1000ML POUR (IV SOLUTION) ×3 IMPLANT

## 2019-07-16 NOTE — Anesthesia Postprocedure Evaluation (Signed)
Anesthesia Post Note  Patient: Kristine Zimmerman  Procedure(s) Performed: ANTERIOR CERVICAL DECOMPRESSION FUSION CERVICAL SEVEN-THORACIC ONE. LEFT-SIDED APPROACH (Left Spine Cervical)     Patient location during evaluation: PACU Anesthesia Type: General Level of consciousness: awake and alert Pain management: pain level controlled Vital Signs Assessment: post-procedure vital signs reviewed and stable Respiratory status: spontaneous breathing, nonlabored ventilation, respiratory function stable and patient connected to nasal cannula oxygen Cardiovascular status: blood pressure returned to baseline and stable Postop Assessment: no apparent nausea or vomiting Anesthetic complications: no    Last Vitals:  Vitals:   07/16/19 1230 07/16/19 1317  BP: 121/72 (!) 124/59  Pulse: 64 65  Resp: 18 18  Temp: 36.7 C (!) 36.4 C  SpO2: 95% 98%    Last Pain:  Vitals:   07/16/19 1317  TempSrc: Oral  PainSc:                  Sena Clouatre S

## 2019-07-16 NOTE — Anesthesia Procedure Notes (Addendum)
Procedure Name: Intubation Date/Time: 07/16/2019 9:52 AM Performed by: Janene Harvey, CRNA Pre-anesthesia Checklist: Patient identified, Emergency Drugs available, Suction available and Patient being monitored Patient Re-evaluated:Patient Re-evaluated prior to induction Oxygen Delivery Method: Circle system utilized Preoxygenation: Pre-oxygenation with 100% oxygen Induction Type: IV induction Ventilation: Oral airway inserted - appropriate to patient size and Mask ventilation without difficulty Laryngoscope Size: Glidescope and 3 Grade View: Grade I Tube type: Oral Tube size: 7.0 mm Number of attempts: 1 Airway Equipment and Method: Stylet,  Oral airway and Video-laryngoscopy Placement Confirmation: ETT inserted through vocal cords under direct vision,  positive ETCO2 and breath sounds checked- equal and bilateral Secured at: 22 cm Tube secured with: Tape Dental Injury: Teeth and Oropharynx as per pre-operative assessment  Comments: Elective glidescope. Limited ROM/small mouth opening. ACDF.

## 2019-07-16 NOTE — Op Note (Signed)
Preoperative diagnosis: Cervical spondylosis with HNP C7-T1  Postoperative diagnosis: Same  Procedure: Anterior cervical discectomy and fusion C7-T1 utilizing the globus Hebron titanium cage packed with locally harvested autograft mixed with vivigen and the globus resonate plate  Surgeon: Dominica Severin Alejandra Barna  Assistant: Nash Shearer  Anesthesia: General  EBL: Minimal  HPI: 49 year old female with progressive worsening neck pain right shoulder and arm pain consistent with a C8 radicular pattern work-up revealed severe cervical spondylosis stenosis herniated nucleus pulposus C7-T1 below her previous C4-C7 fusion.  Due to patient's failed conservative treatment imaging findings and progression of clinical syndrome I recommended an ACDF at C7-T1 I extensively went over the risks and benefits of that operation with her as well as perioperative course expectations of outcome and alternatives of surgery and she understood and agreed to proceed forward.  Operative procedure: Patient was brought into the OR was due to general esthesia positioned supine the neck in slight extension 5 pounds halter traction the left side of her neck was prepped and draped in routine sterile fashion and preoperative x-ray localized the appropriate level so a curvilinear incision was made just off the midline to the anterior border of the sternocleidomastoid and superficial platysma was dissected out divided longitudinally the avascular plane between the sternomastoid and strap was was developed down to the previous fashion.  Fascia dissected [place].  I immediately identified the old plate dissected this free and identified the C7-T1 disc base.  Anterior osteophytes were removed with a Leksell rongeur disc base was then scraped cleaned out and drilled out with a high-speed drill.  Under microscopic lamination further under biting of the and the endplates allowed defecation posterior longitudinal ligament which was removed in piecemeal  fashion.  Then the thecal sac was identified several large free fragments of disc were removed both from the disc base as well as from behind the C6-7 and T1 vertebral bodies.  Identified both C8 pedicles and skeletonized both C8 nerve roots flush with the pedicle.  At the end of discectomy there is no further stenosis no further fragments of disc appreciated then I selected a 6 mm parallel Hebron cage packed with locally harvested autograft mix and then inserted it 1 to 2 mm deep to the anterior vertebral line.  I then selected one of the globus resonate plates 11 mm in place that all screws with excellent purchase locking mechanisms were engaged the wounds and copiously irrigated meticulous hemostasis was maintained and the wound was closed in layers with Vicryl with a running 4 subcuticular in the skin Dermabond benzoin Steri-Strips and a sterile dressing was applied patient recovery in stable condition.  At the end the case all needle counts and sponge counts were correct.

## 2019-07-16 NOTE — H&P (Signed)
Kristine Zimmerman is an 49 y.o. female.   Chief Complaint: Neck pain right shoulder and arm pain HPI: 49 year old female status post ACDF last summer doing fairly well with significant provement preoperative neck and arm pain however over the last several weeks months has had progressive worsening neck and right shoulder and arm pain consistent with a right C8 radiculopathy.  Due to patient's failed conservative treatment imaging findings and progression of clinical syndrome I recommended ACDF at C7-T1.  I have extensively gone over the risks and benefits of that operation with her as well as perioperative course expectations of outcome and alternatives of surgery and she understood and agreed to proceed forward.  She was evaluated by ENT prior to surgery so we could perform a left-sided approach to minimize the risk of the recurrent laryngeal nerve with a C7-T1 discectomy.  Past Medical History:  Diagnosis Date  . Anxiety   . Brain tumor (benign) ([place])   . Chronic right shoulder pain   . Depression   . Diverticulitis   . Diverticulosis   . GERD (gastroesophageal reflux disease)   . Hypertension   . IBS (irritable bowel syndrome)   . Ileus, postoperative ([place]) 02/2019  . Migraines   . Partial bowel obstruction ([place]) 02/2019  . PONV (postoperative nausea and vomiting)    "Scopolamine patch helps a lot" per patient  . Pre-diabetes   . Seizures ([place]) 1990   07/12/2019 - "due to brain tumor, which has since been removed and no seizures since 1999"  . Thyroid disease   . Vertigo     Past Surgical History:  Procedure Laterality Date  . ABDOMINAL HYSTERECTOMY    . BELPHAROPTOSIS REPAIR Left   . BRAIN SURGERY    . CERVICAL SPINE SURGERY  2020  . COLONOSCOPY WITH ESOPHAGOGASTRODUODENOSCOPY (EGD)    . COSMETIC SURGERY     Facial surgery due to dog bite  . ENDOMETRIAL ABLATION    . LAPAROSCOPIC LYSIS OF ADHESIONS  03/09/2019   with release of small bowel obstruction  . NASAL SINUS SURGERY     . OVARIAN CYST REMOVAL Right     History reviewed. No pertinent family history. Social History:  reports that she has never smoked. She has never used smokeless tobacco. She reports that she does not drink alcohol or use drugs.  Allergies:  Allergies  Allergen Reactions  . Estradiol Rash    PATCH   . Morphine Swelling    Medications Prior to Admission  Medication Sig Dispense Refill  . cholecalciferol (VITAMIN D3) 25 MCG (1000 UNIT) tablet Take 1,000 Units by mouth in the morning and at bedtime.    Marland Kitchen estradiol (ESTRACE) 1 MG tablet Take 1 mg by mouth daily.    . hydrochlorothiazide (HYDRODIURIL) 25 MG tablet Take 25 mg by mouth every morning.    . meclizine (ANTIVERT) 12.5 MG tablet Take 12.5 mg by mouth 3 (three) times daily as needed for dizziness.    . montelukast (SINGULAIR) 10 MG tablet Take 10 mg by mouth at bedtime as needed (allergies).     . pantoprazole (PROTONIX) 40 MG tablet Take 40 mg by mouth daily.    . baclofen (LIORESAL) 10 MG tablet Take 1 tablet (10 mg total) by mouth 3 (three) times daily as needed for muscle spasms. (Patient not taking: Reported on 07/02/2019) 60 each 0  . diclofenac (VOLTAREN) 75 MG EC tablet Take 1 tablet (75 mg total) by mouth 2 (two) times daily. (Patient not taking: Reported on  07/02/2019) 60 tablet 1  . ondansetron (ZOFRAN ODT) 4 MG disintegrating tablet Take 1 tablet (4 mg total) by mouth every 6 (six) hours as needed for nausea or vomiting. (Patient not taking: Reported on 07/02/2019) 20 tablet 0  . traMADol (ULTRAM) 50 MG tablet Take 1 tablet (50 mg total) by mouth every 6 (six) hours as needed. (Patient not taking: Reported on 04/26/2018) 20 tablet 0    Results for orders placed or performed during the hospital encounter of 07/16/19 (from the past 48 hour(s))  Glucose, capillary     Status: Abnormal   Collection Time: 07/16/19  8:34 AM  Result Value Ref Range   Glucose-Capillary 107 (H) 70 - 99 mg/dL    Comment: Glucose reference range  applies only to samples taken after fasting for at least 8 hours.   No results found.  Review of Systems  Musculoskeletal: Positive for neck pain.  Neurological: Positive for numbness.    Blood pressure 125/73, pulse 67, temperature (!) 97.3 F (36.3 C), temperature source Tympanic, resp. rate 18, height 5\' 5"  (1.651 m), weight 81.6 kg, last menstrual period 03/29/2017, SpO2 97 %. Physical Exam  Constitutional: She is oriented to person, place, and time. She appears well-developed.  HENT:  Head: Normocephalic.  Eyes: Pupils are equal, round, and reactive to light.  Respiratory: Effort normal.  GI: Soft.  Musculoskeletal:        General: Normal range of motion.     Cervical back: Normal range of motion.  Neurological: She is alert and oriented to person, place, and time. She has normal strength. GCS eye subscore is 4. GCS verbal subscore is 5. GCS motor subscore is 6.  Is awake and alert strength is 5 out of 5 deltoid, bicep, tricep, wrist flexion, wrist extension, hand intrinsics.  Skin: Skin is warm and dry.     Assessment/Plan 49 year old female presents for ACDF C7-T1.  Jyll Tomaro P, MD 07/16/2019, 8:54 AM

## 2019-07-16 NOTE — Transfer of Care (Signed)
Immediate Anesthesia Transfer of Care Note  Patient: Kristine Zimmerman  Procedure(s) Performed: ANTERIOR CERVICAL DECOMPRESSION FUSION CERVICAL SEVEN-THORACIC ONE. LEFT-SIDED APPROACH (Left Spine Cervical)  Patient Location: PACU  Anesthesia Type:General  Level of Consciousness: drowsy  Airway & Oxygen Therapy: Patient Spontanous Breathing and Patient connected to face mask oxygen  Post-op Assessment: Report given to RN and Post -op Vital signs reviewed and stable  Post vital signs: Reviewed  Last Vitals:  Vitals Value Taken Time  BP 117/52 07/16/19 1146  Temp    Pulse 69 07/16/19 1154  Resp 15 07/16/19 1154  SpO2 93 % 07/16/19 1154  Vitals shown include unvalidated device data.  Last Pain:  Vitals:   07/16/19 1145  TempSrc:   PainSc: (P) Asleep      Patients Stated Pain Goal: 2 ([id number])  Complications: No apparent anesthesia complications. Pt moving extremities x4.

## 2019-07-17 DIAGNOSIS — M5023 Other cervical disc displacement, cervicothoracic region: Secondary | ICD-10-CM | POA: Diagnosis not present

## 2019-07-17 MED ORDER — OXYCODONE-ACETAMINOPHEN 5-325 MG PO TABS: 1.0000 | ORAL_TABLET | ORAL | 0 refills | Status: DC | PRN

## 2019-07-17 MED ORDER — METHOCARBAMOL 500 MG PO TABS: 500.0000 mg | ORAL_TABLET | Freq: Four times a day (QID) | ORAL | 0 refills | Status: DC

## 2019-07-17 NOTE — Plan of Care (Signed)
Pt given D/C instructions with verbal understanding. Rx's were sent to the pharmacy by MD. Pt's incision is clean and dry with no sign of infection. Pt's IV was removed prior to D/C. Pt D/C'd home via wheelchair per MD order. Pt is stable @ D/C and has no other needs at this time. William Schake, RN  

## 2019-07-17 NOTE — Discharge Summary (Signed)
Physician Discharge Summary  Patient ID: Kristine Zimmerman MRN: [id number] DOB/AGE: 09/28/1970 49 y.o.  Admit date: 07/16/2019 Discharge date: 07/17/2019  Admission Diagnoses: Cervical spondylosis with HNP C7-T1   Discharge Diagnoses: same   Discharged Condition: good  Hospital Course: The patient was admitted on 07/16/2019 and taken to the operating room where the patient underwent acdf C7-T1. The patient tolerated the procedure well and was taken to the recovery room and then to the floor in stable condition. The hospital course was routine. There were no complications. The wound remained clean dry and intact. Pt had appropriate neck soreness. No complaints of arm pain or new N/T/W. The patient remained afebrile with stable vital signs, and tolerated a regular diet. The patient continued to increase activities, and pain was well controlled with oral pain medications.   Consults: None  Significant Diagnostic Studies:  Results for orders placed or performed during the hospital encounter of 07/16/19  Glucose, capillary  Result Value Ref Range   Glucose-Capillary 107 (H) 70 - 99 mg/dL  Glucose, capillary  Result Value Ref Range   Glucose-Capillary 134 (H) 70 - 99 mg/dL    DG Cervical Spine 2-3 Views  Result Date: 07/16/2019 CLINICAL DATA:  ACDF. EXAM: CERVICAL SPINE - 2-3 VIEW COMPARISON:  Radiographs 04/03/2019. MRI 04/09/2019. FINDINGS: C-arm fluoroscopy was provided in the operating room. 6 seconds of fluoroscopy time. 2 lateral spot fluoroscopic images are submitted from the operating room. Pre-existing ACDF extending from C4 through C7 with an anterior plate, screws and intervertebral bone plugs. Initial image demonstrates anterior localization of the C7-T1 disc space level. Subsequent images demonstrate performance of anterior discectomy and fusion at C7-T1 with a new plate, screws and intervertebral bone plugs. Surgical sponges are present anteriorly in the surgical bed. IMPRESSION:  Intraoperative views during C7-T1 ACDF. Pre-existing ACDF from C4 through C7. Electronically Signed   By: Richardean Sale M.D.   On: 07/16/2019 11:59   DG C-Arm 1-60 Min  Result Date: 07/16/2019 CLINICAL DATA:  Anterior fusion EXAM: DG C-ARM 1-60 MIN FLUOROSCOPY TIME:  Fluoroscopy Time: 0 minutes 6 seconds Number of Acquired Spot Images: 2 COMPARISON:  None. FINDINGS: Intraoperative lateral view shows multilevel anterior screw and plate fixation with support hardware intact. Exact levels cannot be determined on submitted images due to underexposure of the bony structures. IMPRESSION: Multilevel fixation with support hardware intact. Exact levels cannot be determined on underexposed intraoperative images. Electronically Signed   By: Lowella Grip III M.D.   On: 07/16/2019 12:00    Antibiotics:  Anti-infectives (From admission, onward)   Start     Dose/Rate Route Frequency Ordered Stop   07/16/19 1800  ceFAZolin (ANCEF) IVPB 2g/100 mL premix     2 g 200 mL/hr over 30 Minutes Intravenous Every 8 hours 07/16/19 1309 07/17/19 0224   07/16/19 1044  bacitracin 50,000 Units in sodium chloride 0.9 % 500 mL irrigation  Status:  Discontinued       As needed 07/16/19 1044 07/16/19 1140   07/16/19 0830  ceFAZolin (ANCEF) IVPB 2g/100 mL premix     2 g 200 mL/hr over 30 Minutes Intravenous On call to O.R. 07/16/19 0820 07/16/19 1018      Discharge Exam: Blood pressure 107/64, pulse (!) 55, temperature 98.4 F (36.9 C), temperature source Oral, resp. rate 16, height 5\' 5"  (1.651 m), weight 81.6 kg, last menstrual period 03/29/2017, SpO2 98 %. Neurologic: Grossly normal Ambulating and voiding well, incision cdi  Discharge Medications:   Allergies as of 07/17/2019  Reactions   Estradiol Rash   PATCH    Morphine Swelling      Medication List    TAKE these medications   baclofen 10 MG tablet Commonly known as: LIORESAL Take 1 tablet (10 mg total) by mouth 3 (three) times daily as needed  for muscle spasms.   cholecalciferol 25 MCG (1000 UNIT) tablet Commonly known as: VITAMIN D3 Take 1,000 Units by mouth in the morning and at bedtime.   diclofenac 75 MG EC tablet Commonly known as: VOLTAREN Take 1 tablet (75 mg total) by mouth 2 (two) times daily.   estradiol 1 MG tablet Commonly known as: ESTRACE Take 1 mg by mouth daily.   hydrochlorothiazide 25 MG tablet Commonly known as: HYDRODIURIL Take 25 mg by mouth every morning.   meclizine 12.5 MG tablet Commonly known as: ANTIVERT Take 12.5 mg by mouth 3 (three) times daily as needed for dizziness.   methocarbamol 500 MG tablet Commonly known as: Robaxin Take 1 tablet (500 mg total) by mouth 4 (four) times daily.   montelukast 10 MG tablet Commonly known as: SINGULAIR Take 10 mg by mouth at bedtime as needed (allergies).   ondansetron 4 MG disintegrating tablet Commonly known as: Zofran ODT Take 1 tablet (4 mg total) by mouth every 6 (six) hours as needed for nausea or vomiting.   oxyCODONE-acetaminophen 5-325 MG tablet Commonly known as: Percocet Take 1 tablet by mouth every 4 (four) hours as needed for severe pain.   pantoprazole 40 MG tablet Commonly known as: PROTONIX Take 40 mg by mouth daily.   traMADol 50 MG tablet Commonly known as: ULTRAM Take 1 tablet (50 mg total) by mouth every 6 (six) hours as needed.       Disposition: home   Final Dx: acdf C7-T1  Discharge Instructions     Remove dressing in 72 hours   Complete by: As directed    Call MD for:  difficulty breathing, headache or visual disturbances   Complete by: As directed    Call MD for:  hives   Complete by: As directed    Call MD for:  persistant dizziness or light-headedness   Complete by: As directed    Call MD for:  persistant nausea and vomiting   Complete by: As directed    Call MD for:  redness, tenderness, or signs of infection (pain, swelling, redness, odor or green/yellow discharge around incision site)   Complete  by: As directed    Call MD for:  severe uncontrolled pain   Complete by: As directed    Call MD for:  temperature >100.4   Complete by: As directed    Diet - low sodium heart healthy   Complete by: As directed    Driving Restrictions   Complete by: As directed    No driving for 2 weeks, no riding in the car for 1 week   Increase activity slowly   Complete by: As directed    Lifting restrictions   Complete by: As directed    No lifting more than 8 lbs         Signed: Ocie Cornfield Female Iafrate 07/17/2019, 7:50 AM

## 2019-07-18 ENCOUNTER — Encounter: Payer: Self-pay | Admitting: *Deleted

## 2019-10-04 ENCOUNTER — Ambulatory Visit (INDEPENDENT_AMBULATORY_CARE_PROVIDER_SITE_OTHER): Payer: Medicaid Other | Admitting: Allergy and Immunology

## 2019-10-04 ENCOUNTER — Encounter: Payer: Self-pay | Admitting: Allergy and Immunology

## 2019-10-04 ENCOUNTER — Other Ambulatory Visit: Payer: Self-pay

## 2019-10-04 VITALS — BP 118/70 | HR 69 | Temp 97.8°F | Resp 18 | Ht 63.78 in | Wt 174.6 lb

## 2019-10-04 DIAGNOSIS — T50905A Adverse effect of unspecified drugs, medicaments and biological substances, initial encounter: Secondary | ICD-10-CM | POA: Insufficient documentation

## 2019-10-04 DIAGNOSIS — T63481A Toxic effect of venom of other arthropod, accidental (unintentional), initial encounter: Secondary | ICD-10-CM | POA: Insufficient documentation

## 2019-10-04 DIAGNOSIS — T50905D Adverse effect of unspecified drugs, medicaments and biological substances, subsequent encounter: Secondary | ICD-10-CM

## 2019-10-04 DIAGNOSIS — J4520 Mild intermittent asthma, uncomplicated: Secondary | ICD-10-CM

## 2019-10-04 DIAGNOSIS — T63484D Toxic effect of venom of other arthropod, undetermined, subsequent encounter: Secondary | ICD-10-CM

## 2019-10-04 DIAGNOSIS — J310 Chronic rhinitis: Secondary | ICD-10-CM | POA: Diagnosis not present

## 2019-10-04 HISTORY — DX: Mild intermittent asthma, uncomplicated: J45.20

## 2019-10-04 MED ORDER — ALBUTEROL SULFATE HFA 108 (90 BASE) MCG/ACT IN AERS: 1.0000 | INHALATION_SPRAY | RESPIRATORY_TRACT | 2 refills | Status: AC | PRN

## 2019-10-04 MED ORDER — AZELASTINE-FLUTICASONE 137-50 MCG/ACT NA SUSP: NASAL | 5 refills | Status: AC

## 2019-10-04 NOTE — Assessment & Plan Note (Addendum)
All seasonal and perennial aeroallergen skin tests are negative despite a positive histamine control.  Intranasal steroids, intranasal antihistamines, and first generation antihistamines are effective for symptoms associated with non-allergic rhinitis, whereas second generation antihistamines such as cetirizine (Zyrtec), loratadine (Claritin) and fexofenadine (Allegra) have been found to be ineffective for this condition.  A prescription has been provided for azelastine/fluticasone nasal spray, one spray per nostril 1-2 times daily as needed. Proper nasal spray technique has been discussed and demonstrated.  Nasal saline lavage (NeilMed) has been recommended as needed and prior to medicated nasal sprays along with instructions for proper administration.  For thick post nasal drainage, add guaifenesin 1200 mg (Mucinex Maximum Strength)  twice daily as needed with adequate hydration as discussed.

## 2019-10-04 NOTE — Assessment & Plan Note (Signed)
The history suggest that the coughing may be secondary to postnasal drainage (upper airway cough syndrome).  However, the associated wheezing is suggestive of bronchial hyperresponsiveness.  A prescription has been provided for albuterol HFA, 1 to 2 inhalations every 4-6 hours if needed.  Subjective and objective measures of pulmonary function will be followed and the treatment plan will be adjusted accordingly.

## 2019-10-04 NOTE — Progress Notes (Signed)
New Patient Note  RE: ROSALIN Zimmerman MRN: [id number] DOB: 1970-10-25 Date of Office Visit: 10/04/2019  Referring provider: Meliton Rattan, MD Primary care provider: Kateri Mc, MD  Chief Complaint: Allergic Rhinitis  (allergy from surgery powder)   History of present illness: Kristine Zimmerman is a 49 y.o. female seen today in consultation requested by Meliton Rattan, MD.  She reports that on March 01, 2019 she had a laparoscopic total hysterectomy.  On the night surgery return to the emergency department because she states that she had "really, really high infection and became septic."  She was readmitted to the hospital on the ninth and on the 10th had an NG tube placed to evacuate the gastric contents.  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  When she followed up with the surgeon on January 21 she was told there was "powder residue left in your abdomen."  [name] states that the powder had been removed but believes that "some of it was hit."  She now believes that she is targeted more frequently by flying insects in which she and stung by bees or mosquitoes she develops large local reactions which tend to leave scars. [name] experiences nasal congestion, rhinorrhea, postnasal drainage, throat clearing, and occasional sinus pressure.  She reports that she had sinusitis approximately 2 weeks ago. She also complains of coughing with occasional wheezing.  She reports the cough seems to began as a tickle in the base of her throat, and once coughing she will wheeze on occasion.  She reports that this has happened 2 times per week on average since the total hysterectomy in January. [name] was started on Protonix earlier this year by her otolaryngologist.  Assessment and plan: Local reactions to insect stings I do not know the mechanism by which  exposure to the (unspecified) powder during surgery would cause insects to be more attracted to the patient, however it is possible that since January her mast cell membranes have had diminished stability and therefore release histamine more readily when bitten/stung by an insect.  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  Information regarding Skeeter Syndrome has been discussed.  Recommedations have been provided regarding mosquito avoidance and early treatment with ice, antihistamines, topical corticosteroids and antiinflammatories.  Chronic rhinitis All seasonal and perennial aeroallergen skin tests are negative despite a positive histamine control.  Intranasal steroids, intranasal antihistamines, and first generation antihistamines are effective for symptoms associated with non-allergic rhinitis, whereas second generation antihistamines such as cetirizine (Zyrtec), loratadine (Claritin) and fexofenadine (Allegra) have been found to be ineffective for this condition.  A prescription has been provided for azelastine/fluticasone nasal spray, one spray per nostril 1-2 times daily as needed. Proper nasal spray technique has been discussed and demonstrated.  Nasal saline lavage (NeilMed) has been recommended as needed and prior to medicated nasal sprays along with instructions for proper administration.  For thick post nasal drainage, add guaifenesin 1200 mg (Mucinex Maximum Strength)  twice daily as needed with adequate hydration as discussed.  Coughing/wheezing The history suggest that the coughing may be secondary to postnasal drainage (upper airway cough syndrome).  However, the associated wheezing is suggestive of bronchial hyperresponsiveness.  A prescription has been provided for albuterol HFA, 1 to 2 inhalations every 4-6 hours if needed.  Subjective and objective measures of  pulmonary function will be followed and the treatment plan will be  adjusted accordingly.   Meds ordered this encounter  Medications  . Azelastine-Fluticasone 137-50 MCG/ACT SUSP    Sig: One spray per nostril 1-2 times daily as needed    Dispense:  23 g    Refill:  5  . albuterol (VENTOLIN HFA) 108 (90 Base) MCG/ACT inhaler    Sig: Inhale 1-2 puffs into the lungs every 4 (four) hours as needed for wheezing or shortness of breath.    Dispense:  18 g    Refill:  2    Diagnostics: Spirometry: FVC was 3.03 L and FEV1 was 2.56 L (94% predicted) with 90 mL postbronchodilator improvement.  This study was performed while the patient was asymptomatic.  Please see scanned spirometry results for details. Environmental skin testing: Negative despite a positive histamine control. Food allergen skin testing: Negative despite a positive histamine control.    Physical examination: Blood pressure 118/70, pulse 69, temperature 97.8 F (36.6 C), temperature source Temporal, resp. rate 18, height 5' 3.78" (1.62 m), weight 174 lb 9.6 oz (79.2 kg), last menstrual period 03/29/2017, SpO2 99 %.  General: Alert, interactive, in no acute distress. HEENT: TMs pearly gray, turbinates moderately edematous without discharge, post-pharynx moderately erythematous. Neck: Supple without lymphadenopathy. Lungs: Clear to auscultation without wheezing, rhonchi or rales. CV: Normal S1, S2 without murmurs. Abdomen: Nondistended, nontender. Skin: Warm and dry, without lesions or rashes. Extremities:  No clubbing, cyanosis or edema. Neuro:   Grossly intact.  Review of systems:  Review of systems negative except as noted in HPI / PMHx or noted below: Review of Systems  Constitutional: Negative.   HENT: Negative.   Eyes: Negative.   Respiratory: Negative.   Cardiovascular: Negative.   Gastrointestinal: Negative.   Genitourinary: Negative.   Musculoskeletal: Negative.   Skin: Negative.   Neurological: Negative.   Endo/Heme/Allergies: Negative.   Psychiatric/Behavioral:  Negative.     Past medical history:  Past Medical History:  Diagnosis Date  . Angio-edema   . Anxiety   . Brain tumor (benign) ([place])   . Chronic right shoulder pain   . Depression   . Diverticulitis   . Diverticulosis   . Eczema   . GERD (gastroesophageal reflux disease)   . Hypertension   . IBS (irritable bowel syndrome)   . Ileus, postoperative ([place]) 02/2019  . Migraines   . Mild intermittent asthma 10/04/2019  . Partial bowel obstruction ([place]) 02/2019  . PONV (postoperative nausea and vomiting)    "Scopolamine patch helps a lot" per patient  . Pre-diabetes   . Seizures ([place]) 1990   07/12/2019 - "due to brain tumor, which has since been removed and no seizures since 1999"  . Thyroid disease   . Urticaria   . Vertigo     Past surgical history:  Past Surgical History:  Procedure Laterality Date  . ABDOMINAL HYSTERECTOMY    . ANTERIOR CERVICAL DECOMP/DISCECTOMY FUSION Left 07/16/2019   Procedure: ANTERIOR CERVICAL DECOMPRESSION FUSION CERVICAL SEVEN-THORACIC ONE. LEFT-SIDED APPROACH;  Surgeon: Kary Kos, MD;  Location: [facility];  Service: Neurosurgery;  Laterality: Left;  left-side approach  . BELPHAROPTOSIS REPAIR Left   . BRAIN SURGERY    . CERVICAL SPINE SURGERY  2020  . COLONOSCOPY WITH ESOPHAGOGASTRODUODENOSCOPY (EGD)    . COSMETIC SURGERY     Facial surgery due to dog bite  . ENDOMETRIAL ABLATION    . LAPAROSCOPIC LYSIS OF ADHESIONS  03/09/2019   with release of small bowel obstruction  .  NASAL SINUS SURGERY    . OVARIAN CYST REMOVAL Right   . TYMPANOSTOMY TUBE PLACEMENT      Family history: Family History  Problem Relation Age of Onset  . Asthma Maternal Aunt   . Asthma Maternal Uncle   . Allergic rhinitis Neg Hx   . Eczema Neg Hx   . Urticaria Neg Hx     Social history: Social History   Socioeconomic History  . Marital status: Divorced    Spouse name: Not on file  . Number of children: Not on file  . Years of education: Not on file  . Highest  education level: Not on file  Occupational History  . Not on file  Tobacco Use  . Smoking status: Never Smoker  . Smokeless tobacco: Never Used  Vaping Use  . Vaping Use: Never used  Substance and Sexual Activity  . Alcohol use: No  . Drug use: No  . Sexual activity: Yes    Birth control/protection: Surgical  Other Topics Concern  . Not on file  Social History Narrative  . Not on file   Social Determinants of Health   Financial Resource Strain:   . Difficulty of Paying Living Expenses:   Food Insecurity:   . Worried About Charity fundraiser in the Last Year:   . Arboriculturist in the Last Year:   Transportation Needs:   . Film/video editor (Medical):   Marland Kitchen Lack of Transportation (Non-Medical):   Physical Activity:   . Days of Exercise per Week:   . Minutes of Exercise per Session:   Stress:   . Feeling of Stress :   Social Connections:   . Frequency of Communication with Friends and Family:   . Frequency of Social Gatherings with Friends and Family:   . Attends Religious Services:   . Active Member of Clubs or Organizations:   . Attends Archivist Meetings:   Marland Kitchen Marital Status:   Intimate Partner Violence:   . Fear of Current or Ex-Partner:   . Emotionally Abused:   Marland Kitchen Physically Abused:   . Sexually Abused:     Environmental History: The patient lives in a 51 year old house with carpeting throughout, gas heat, and central air.  There is no known mold/water damage in the home.  There are 2 dogs in the home which do not have access to her bedroom.  She is a non-smoker.  Current Outpatient Medications  Medication Sig Dispense Refill  . albuterol (VENTOLIN HFA) 108 (90 Base) MCG/ACT inhaler Inhale into the lungs.    . cholecalciferol (VITAMIN D3) 25 MCG (1000 UNIT) tablet Take 1,000 Units by mouth in the morning and at bedtime.    Marland Kitchen estradiol (ESTRACE) 1 MG tablet Take 1 mg by mouth daily.    . hydrochlorothiazide (HYDRODIURIL) 25 MG tablet Take 25 mg  by mouth every morning.    Marland Kitchen levocetirizine (XYZAL) 5 MG tablet Take by mouth.    . levothyroxine (SYNTHROID) 25 MCG tablet Take by mouth.    . meclizine (ANTIVERT) 12.5 MG tablet Take 12.5 mg by mouth 3 (three) times daily as needed for dizziness.    . mometasone (NASONEX) 50 MCG/ACT nasal spray Place into the nose.    . montelukast (SINGULAIR) 10 MG tablet Take 10 mg by mouth at bedtime as needed (allergies).     . ondansetron (ZOFRAN ODT) 4 MG disintegrating tablet Take 1 tablet (4 mg total) by mouth every 6 (six) hours as needed for  nausea or vomiting. 20 tablet 0  . pantoprazole (PROTONIX) 40 MG tablet Take 40 mg by mouth daily.    Marland Kitchen triamcinolone cream (KENALOG) 0.1 % APPLY A THIN LAYER TO AFFECTED AREA TWICE DAILY    . albuterol (VENTOLIN HFA) 108 (90 Base) MCG/ACT inhaler Inhale 1-2 puffs into the lungs every 4 (four) hours as needed for wheezing or shortness of breath. 18 g 2  . Azelastine-Fluticasone 137-50 MCG/ACT SUSP One spray per nostril 1-2 times daily as needed 23 g 5   No current facility-administered medications for this visit.    Known medication allergies: Allergies  Allergen Reactions  . Estradiol Rash    PATCH  PATCH  PATCH   . Morphine Swelling    I appreciate the opportunity to take part in Donnajean's care. Please do not hesitate to contact me with questions.  Sincerely,   R. Edgar Frisk, MD

## 2019-10-04 NOTE — Assessment & Plan Note (Signed)
I do not know the mechanism by which exposure to the (unspecified) powder during surgery would cause insects to be more attracted to the patient, however it is possible that since January her mast cell membranes have had diminished stability and therefore release histamine more readily when bitten/stung by an insect.  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  Information regarding Skeeter Syndrome has been discussed.  Recommedations have been provided regarding mosquito avoidance and early treatment with ice, antihistamines, topical corticosteroids and antiinflammatories.

## 2019-10-04 NOTE — Patient Instructions (Addendum)
Local reactions to insect stings I do not know the mechanism by which exposure to the (unspecified) powder during surgery would cause insects to be more attracted to the patient, however it is possible that since January her mast cell membranes have had diminished stability and therefore release histamine more readily when bitten/stung by an insect.  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  Information regarding Skeeter Syndrome has been discussed.  Recommedations have been provided regarding mosquito avoidance and early treatment with ice, antihistamines, topical corticosteroids and antiinflammatories.  Chronic rhinitis All seasonal and perennial aeroallergen skin tests are negative despite a positive histamine control.  Intranasal steroids, intranasal antihistamines, and first generation antihistamines are effective for symptoms associated with non-allergic rhinitis, whereas second generation antihistamines such as cetirizine (Zyrtec), loratadine (Claritin) and fexofenadine (Allegra) have been found to be ineffective for this condition.  A prescription has been provided for azelastine/fluticasone nasal spray, one spray per nostril 1-2 times daily as needed. Proper nasal spray technique has been discussed and demonstrated.  Nasal saline lavage (NeilMed) has been recommended as needed and prior to medicated nasal sprays along with instructions for proper administration.  For thick post nasal drainage, add guaifenesin 1200 mg (Mucinex Maximum Strength)  twice daily as needed with adequate hydration as discussed.  Coughing/wheezing The history suggest that the coughing may be secondary to postnasal drainage (upper airway cough syndrome).  However, the associated wheezing is suggestive of bronchial hyperresponsiveness.  A prescription has been provided for albuterol HFA, 1 to 2 inhalations every 4-6 hours if needed.  Subjective and objective measures of  pulmonary function will be followed and the treatment plan will be adjusted accordingly.   Return in about 4 months (around 02/03/2020), or if symptoms worsen or fail to improve.   Skeeter Syndrome Treatment   Mosquito avoidance (see information below)  Ice affected area  Oral antihistamine (Benadryl or Zyrtec)  Oral anti-inflammatory (ibuprofen)  Topical corticosteroid (Hydrocortisone cream 1%)    Strategies for Safer Mosquito Avoidance  by [place] are a terrible nuisance in the muggy summer months, especially now that the ferocious Asian tiger mosquito has made a permanent home here in [place]. The arrival of [place] virus has added some urgency to mosquito control measures, but spray programs and many repellents may do more harm than good in the long term. Choosing the least-toxic solutions can protect both your health and comfort in mosquito season. Here are some suggestions for safer and more effective bite avoidance this summer.   Population Control  Keeping mosquito populations in check is the most important way to avoid bites. It's no secret that removing sources of standing water is crucial to eliminating mosquito breeding grounds. Common breeding sites to watch for include:  * Rain gutters. Clean them out and offer to do the same for elderly neighbors or others who may not be able to do the job themselves. Remember that mosquito control is a community-wide effort.  * Flowerpots, buckets and old tires. Be sure empty containers cannot hold water.  * Bird baths and pet dishes. Empty and clean them weekly.  * Recycling bins and the cans inside. These may harbor stagnant water if not emptied regularly.  * Rain barrels. Be sure they are sealed off from mosquitoes.  * Storm drains. Watch for clogs from branches and garbage.  Insecticide sprays targeting adult mosquitoes can only reduce mosquito populations for a day or two. In fact, since  insecticides  also kill off important mosquito predators such as dragonflies, a spray program can actually be counter-productive by leaving the rebounding mosquito population without natural enemies.  Instead, interrupt the breeding cycle by using the nontoxic bacterial larvicide Bacillus thuringiensis var. israelensis (Bti). Bti is sold in convenient donuts called "mosquito dunks" that you can safely use in your bird bath, rain barrel or low areas around your yard to kill mosquito larvae before the adults emerge and spread throughout the community, where they become much harder to kill. Bti is not harmful to fish, birds or mammals, and single applications can remain effective for a month or more, even if the water source dries out and refills.   Safer Repellents  If you'll be outdoors at dawn or dusk when mosquitoes are most active, wear long clothes that don't leave skin exposed. (You may use insect repellent on your clothes). When you do get bites, soothe them by slathering on an astringent such as witch hazel after you come inside -- it will prevent scratching and allow bites to heal quickly.  Lately many public health officials concerned about [place] virus have been advising people to use repellents containing the pesticide DEET (N,N-diethyl-meta-toluamide). While DEET is an extremely effective mosquito repellent, it is also a neurotoxin, and studies have shown that prolonged frequent exposure can irritate skin, cause muscle twitching and weakness and harm the brain and nervous system, especially when combined with other pesticides such as permethrin.  Consumer studies report that [employer]'s Skin-So-Soft and herbal repellents containing citronella can be just as effective as DEET at repelling mosquitoes but need to be applied more often. The solution is to choose the safer formulas and reapply as needed.  General guidelines for using any insect repellent:  * Choose oils or lotions rather than sprays, which produce  fine particles that are easily inhaled.  * Do not apply repellents to broken skin.  * Do not allow children to apply their own repellent, and do not apply repellents containing DEET or other pesticides directly to children's skin. If you use such products, they can be applied to children's clothing instead.  * Do not use sunscreen/repellent combinations. Sunscreen needs to be reapplied more often than repellents, so the combination products can result in overexposure to pesticides.  * Wash off all repellent from skin and clothing immediately after coming indoors.  Area-wide repellent strategies can also be effective for outdoor gatherings. There are various contraptions available that emit carbon dioxide to trap mosquitoes (such as the Mosquito Magnet and Mosquito Deleto). These are expensive, but they do work, and some companies will even rent them to you for an outdoor event. Citronella candles are also effective when there is no breeze, but beware of candles containing pesticides -- the smoke is easily inhaled and can irritate the airway. Placing fans around your porch or patio can blow mosquitoes away.  Keep in mind that only female mosquitoes actually bite and that most mosquito species in this area do not transmit West Nile virus. You are most at risk of being bitten by a mosquito carrying the disease at dawn and dusk, and even in these cases your chances of actually contracting the virus are extremely low. So take sensible steps to keep the buggers under control, but also keep them in perspective as the annoyances they are.

## 2019-10-12 DIAGNOSIS — I10 Essential (primary) hypertension: Secondary | ICD-10-CM | POA: Insufficient documentation

## 2019-11-05 ENCOUNTER — Ambulatory Visit: Payer: Self-pay | Admitting: Orthopaedic Surgery

## 2019-11-13 ENCOUNTER — Ambulatory Visit: Payer: Self-pay

## 2019-11-13 ENCOUNTER — Ambulatory Visit (INDEPENDENT_AMBULATORY_CARE_PROVIDER_SITE_OTHER): Payer: Medicaid Other | Admitting: Orthopaedic Surgery

## 2019-11-13 ENCOUNTER — Encounter: Payer: Self-pay | Admitting: Orthopaedic Surgery

## 2019-11-13 DIAGNOSIS — M25561 Pain in right knee: Secondary | ICD-10-CM

## 2019-11-13 DIAGNOSIS — M25562 Pain in left knee: Secondary | ICD-10-CM | POA: Diagnosis not present

## 2019-11-13 DIAGNOSIS — G8929 Other chronic pain: Secondary | ICD-10-CM | POA: Diagnosis not present

## 2019-11-13 MED ORDER — LIDOCAINE HCL 1 % IJ SOLN
0.5000 mL | INTRAMUSCULAR | Status: AC | PRN
  Administered 2019-11-13: .5 mL

## 2019-11-13 MED ORDER — METHYLPREDNISOLONE ACETATE 40 MG/ML IJ SUSP
40.0000 mg | INTRAMUSCULAR | Status: AC | PRN
  Administered 2019-11-13: 40 mg via INTRA_ARTICULAR

## 2019-11-13 NOTE — Progress Notes (Signed)
Office Visit Note   Patient: Kristine Zimmerman           Date of Birth: 1970/08/31           MRN: [id number] Visit Date: 11/13/2019              Requested by: Kateri Mc, MD [address] [place],  [place] [address] PCP: Kateri Mc, MD   Assessment & Plan: Visit Diagnoses:  1. Chronic pain of right knee   2. Chronic pain of left knee     Plan: Discussed with patient quad strengthening exercises and knee friendly exercises.  We will see him back in 2 weeks see what type of response she had to the injections both knees.  Questions were encouraged and answered by Dr. Ninfa Linden and myself.  Follow-Up Instructions: No follow-ups on file.   Orders:  Orders Placed This Encounter  Procedures  . Large Joint Inj  . XR Knee 1-2 Views Right  . XR Knee 1-2 Views Left   No orders of the defined types were placed in this encounter.     Procedures: Large Joint Inj: bilateral knee on 11/13/2019 11:25 AM Indications: pain Details: 22 G 1.5 in needle, anterolateral approach  Arthrogram: No  Medications (Right): 0.5 mL lidocaine 1 %; 40 mg methylPREDNISolone acetate 40 MG/ML Medications (Left): 0.5 mL lidocaine 1 %; 40 mg methylPREDNISolone acetate 40 MG/ML Outcome: tolerated well, no immediate complications Procedure, treatment alternatives, risks and benefits explained, specific risks discussed. Consent was given by the patient. Immediately prior to procedure a time out was called to verify the correct patient, procedure, equipment, support staff and site/side marked as required. Patient was prepped and draped in the usual sterile fashion.       Clinical Data: No additional findings.   Subjective: Chief Complaint  Patient presents with  . Right Knee - Pain  . Left Knee - Pain    HPI Kristine Zimmerman is a 49 year old female were seen for the first time for bilateral knee pain.  She states that the knee pain has been ongoing for years but this became worse over the last  year.  Left knee pain worse than the right.  She states that she has had no known injury to either knee.  In the late 1990s she did missed step and came down on her left knee sustaining a laceration that did not require stitches.  She does have varicose veins and is having some venous stripping done in the near future on the right leg.  She notes she has been difficulty going up and down stairs due to bilateral knee pain and has to move her legs if she sits for too long due to achiness in both knees.  She notes swelling in the left knee particularly on the lateral aspect and right knee anteriorly.  She has had recent cervical spine surgery and has been told not to take NSAIDs at this point. Therefore she is taking Tylenol.  She is also tried a cane.  She denies any physical therapy or other medications for her knees.  She notes that the left knee gives way at times and both knees have painful popping in them at times.  She is nondiabetic.  Review of Systems  Constitutional: Negative for chills and fever.  Musculoskeletal: Positive for arthralgias and joint swelling.     Objective: Vital Signs: LMP 03/29/2017   Physical Exam Constitutional:      Appearance: She is not ill-appearing or  diaphoretic.  Pulmonary:     Effort: Pulmonary effort is normal.  Neurological:     Mental Status: She is alert and oriented to person, place, and time.  Psychiatric:        Mood and Affect: Mood normal.     Ortho Exam Bilateral knees full range of motion no significant patellofemoral crepitus.  She has tenderness along the lateral medial joint line of the left knee.  Tenderness along the medial joint line of the right.  No tenderness over the pes anserinus region of either knee.  No instability valgus varus stressing of either knee.  Anterior drawer is negative bilaterally.  McMurray's is negative bilaterally.  No abnormal warmth erythema or effusion of either knee. Specialty Comments:  No specialty comments  available.  Imaging: XR Knee 1-2 Views Left  Result Date: 11/13/2019 Left knee AP and lateral views: Knee is well-preserved.  No acute fractures.  No effusion no bony abnormalities.  XR Knee 1-2 Views Right  Result Date: 11/13/2019 Right knee AP lateral views: All 3 compartments well preserved.  No bony abnormalities.  Knee is well located.  No evidence of effusion.    PMFS History: Patient Active Problem List   Diagnosis Date Noted  . Local reactions to insect stings 10/04/2019  . Coughing/wheezing 10/04/2019  . Adverse drug reaction 10/04/2019  . Third degree laceration of perineum, type 3c 07/16/2019  . Neck pain 05/12/2017  . IBS (irritable bowel syndrome) 04/14/2017  . Overweight (BMI 25.0-29.9) 04/14/2017  . Right shoulder pain 04/14/2017  . Chronic rhinitis 03/29/2016  . Diverticulosis 03/29/2016  . History of pituitary tumor 03/29/2016  . Hypothyroidism 03/29/2016  . Vertigo 03/29/2016  . Vestibular migraine 03/29/2016   Past Medical History:  Diagnosis Date  . Angio-edema   . Anxiety   . Brain tumor (benign) ([facility])   . Chronic right shoulder pain   . Depression   . Diverticulitis   . Diverticulosis   . Eczema   . GERD (gastroesophageal reflux disease)   . Hypertension   . IBS (irritable bowel syndrome)   . Ileus, postoperative ([facility]) 02/2019  . Migraines   . Mild intermittent asthma 10/04/2019  . Partial bowel obstruction ([facility]) 02/2019  . PONV (postoperative nausea and vomiting)    "Scopolamine patch helps a lot" per patient  . Pre-diabetes   . Seizures ([facility]) 1990   07/12/2019 - "due to brain tumor, which has since been removed and no seizures since 1999"  . Thyroid disease   . Urticaria   . Vertigo     Family History  Problem Relation Age of Onset  . Asthma Maternal Aunt   . Asthma Maternal Uncle   . Allergic rhinitis Neg Hx   . Eczema Neg Hx   . Urticaria Neg Hx     Past Surgical History:  Procedure Laterality Date  . ABDOMINAL HYSTERECTOMY      . ANTERIOR CERVICAL DECOMP/DISCECTOMY FUSION Left 07/16/2019   Procedure: ANTERIOR CERVICAL DECOMPRESSION FUSION CERVICAL SEVEN-THORACIC ONE. LEFT-SIDED APPROACH;  Surgeon: Kary Kos, MD;  Location: [facility];  Service: Neurosurgery;  Laterality: Left;  left-side approach  . BELPHAROPTOSIS REPAIR Left   . BRAIN SURGERY    . CERVICAL SPINE SURGERY  2020  . COLONOSCOPY WITH ESOPHAGOGASTRODUODENOSCOPY (EGD)    . COSMETIC SURGERY     Facial surgery due to dog bite  . ENDOMETRIAL ABLATION    . LAPAROSCOPIC LYSIS OF ADHESIONS  03/09/2019   with release of small bowel obstruction  . NASAL  SINUS SURGERY    . OVARIAN CYST REMOVAL Right   . TYMPANOSTOMY TUBE PLACEMENT     Social History   Occupational History  . Not on file  Tobacco Use  . Smoking status: Never Smoker  . Smokeless tobacco: Never Used  Vaping Use  . Vaping Use: Never used  Substance and Sexual Activity  . Alcohol use: No  . Drug use: No  . Sexual activity: Yes    Birth control/protection: Surgical

## 2019-11-27 ENCOUNTER — Other Ambulatory Visit: Payer: Self-pay

## 2019-11-27 ENCOUNTER — Encounter: Payer: Self-pay | Admitting: Orthopaedic Surgery

## 2019-11-27 ENCOUNTER — Ambulatory Visit (INDEPENDENT_AMBULATORY_CARE_PROVIDER_SITE_OTHER): Payer: Medicaid Other | Admitting: Orthopaedic Surgery

## 2019-11-27 DIAGNOSIS — G8929 Other chronic pain: Secondary | ICD-10-CM

## 2019-11-27 DIAGNOSIS — M25562 Pain in left knee: Secondary | ICD-10-CM

## 2019-11-27 DIAGNOSIS — M25561 Pain in right knee: Secondary | ICD-10-CM | POA: Diagnosis not present

## 2019-11-27 NOTE — Progress Notes (Signed)
The patient comes in today with continued bilateral chronic knee pain with the right worse than left.  She saw [place] a few weeks ago and steroid injections were placed in both knees.  She said she had 1 week of being completely pain-free.  The right knee has been swelling and hurting again.  She does have a history of painful varicose veins around her right leg and knee.  She is scheduled to have some type of vein surgery in the future.  She has had no other acute change in medical status but she does get some chronic pain around in different areas having had multiple surgeries on her abdomen and other areas in the past.  She has never had knee surgery.  She denies any acute injuries to her knees.  Examination of both knees shows no significant effusion.  The right knee does have lateral and medial joint line tenderness and pain on exam throughout the arc of motion of the knee and with rotating the tibia on the fibula more so on the lateral compartment.  There is torturous varicose veins around her right knee and lower leg as well as ankle.  At this point, we will obtain an MRI of the right knee to assess the cartilage or look for any other internal derangement it is causing her daily knee pain.  She should continue to work on at least quad training exercises.  She will call once she has the MRI scheduled for a follow-up appointment.

## 2019-12-05 DIAGNOSIS — Z8719 Personal history of other diseases of the digestive system: Secondary | ICD-10-CM | POA: Insufficient documentation

## 2019-12-14 ENCOUNTER — Other Ambulatory Visit: Payer: Self-pay

## 2019-12-14 ENCOUNTER — Ambulatory Visit
Admission: RE | Admit: 2019-12-14 | Discharge: 2019-12-14 | Disposition: A | Discharge status: Home / Self Care | Payer: Medicaid Other | Source: Ambulatory Visit | Attending: Orthopaedic Surgery | Admitting: Orthopaedic Surgery

## 2019-12-14 DIAGNOSIS — M25561 Pain in right knee: Secondary | ICD-10-CM

## 2019-12-14 DIAGNOSIS — G8929 Other chronic pain: Secondary | ICD-10-CM

## 2019-12-14 IMAGING — MR MR KNEE*R* W/O CM
4 of 6 series · 19 of 40 positions shown · non-contrast
Comparison: Plain films of both knees [DATE].

CLINICAL DATA: Anterior right knee pain and swelling for 2 years.
No known injury.

EXAM:
MRI OF THE RIGHT KNEE WITHOUT CONTRAST
TECHNIQUE: Multiplanar, multisequence MR imaging of the knee was performed. No
intravenous contrast was administered.

[Series 3: T2 fat-sat · axial · 4.0mm · 0.31mm/px · z∈[-26,+62]mm · 3 of 25 slices shown (1 of 2)]
[im 5/25]
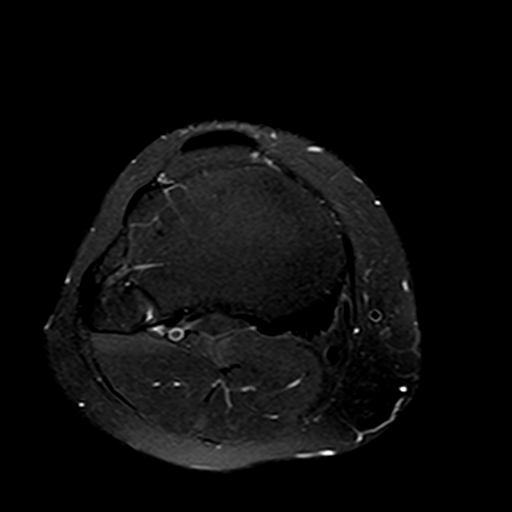
[im 15/25]
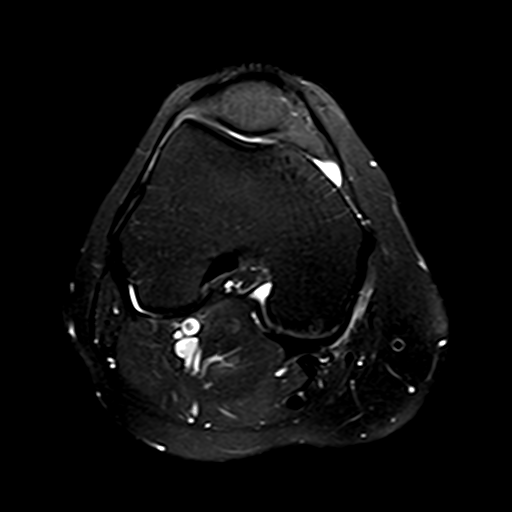
[im 25/25]
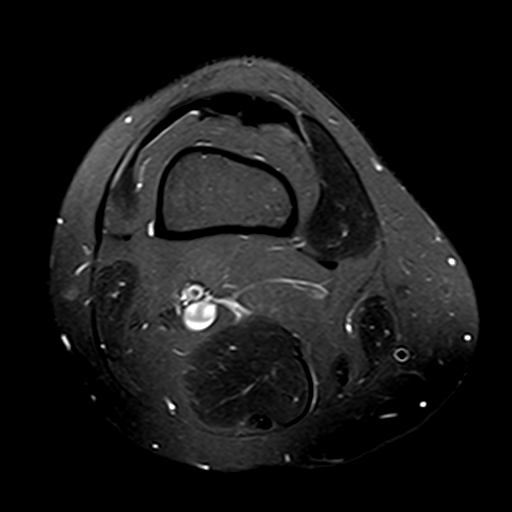

[Series 5: T2 fat-sat · coronal · 4.0mm · 0.29mm/px · 3 of 24 slices shown (2 of 2)]
[im 5/24]
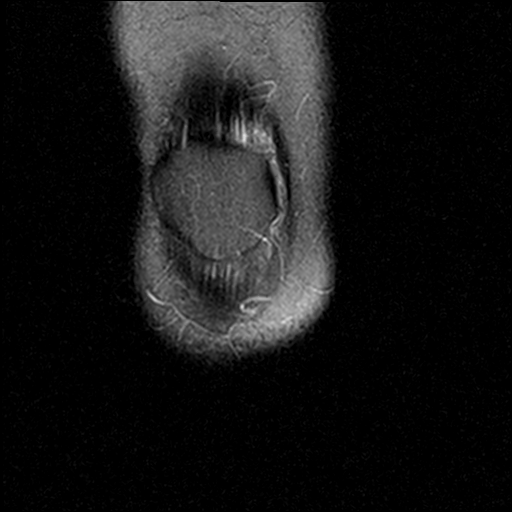
[im 14/24]
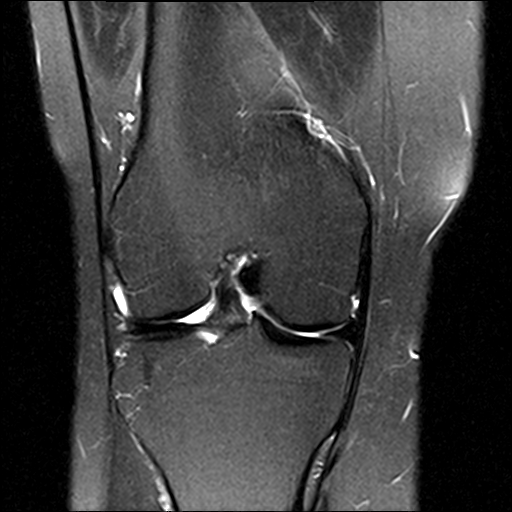
[im 24/24]
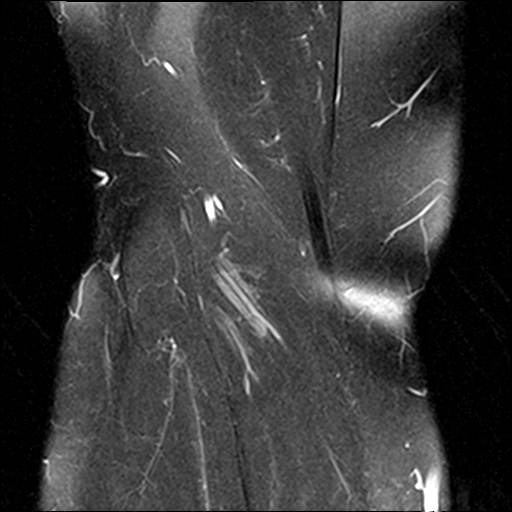

[Series 6: PD fat-sat · coronal · 3.0mm · 0.29mm/px · 7 of 28 slices shown (1 of 2)]
[im 1/28]
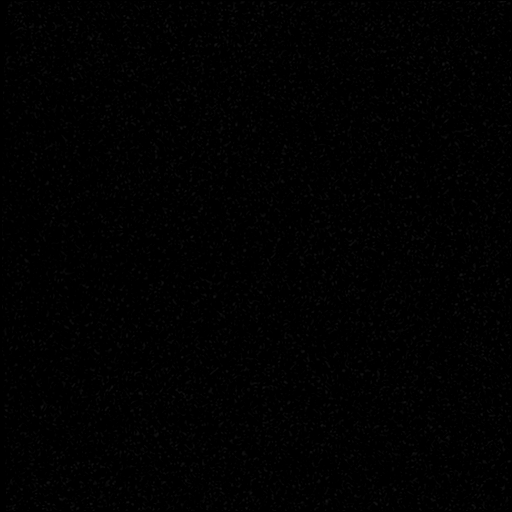
[im 5/28]
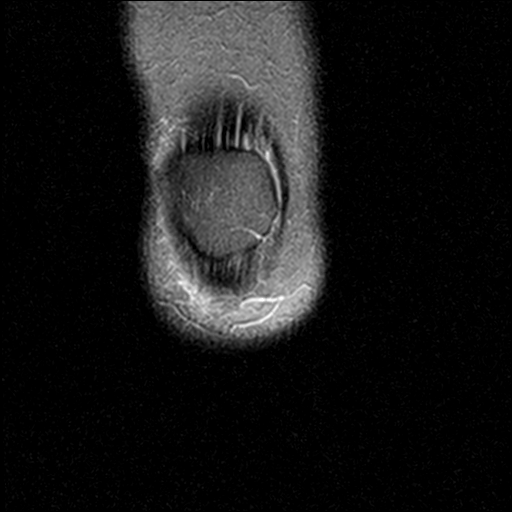
[im 10/28]
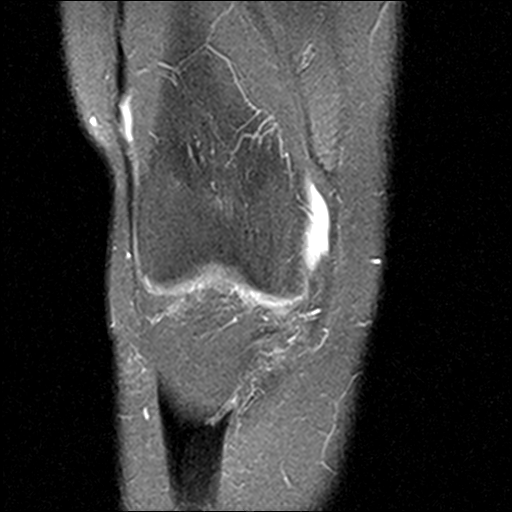
[im 14/28]
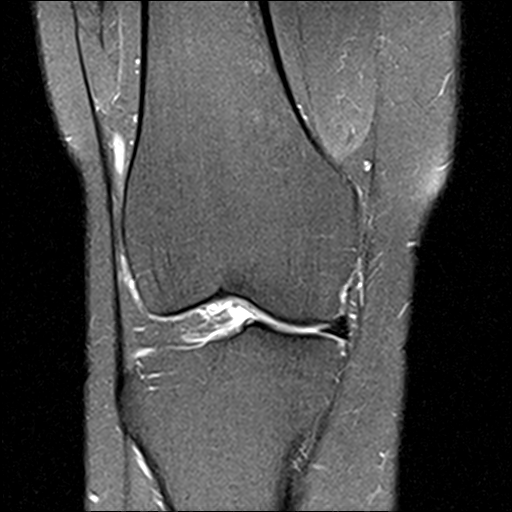
[im 19/28]
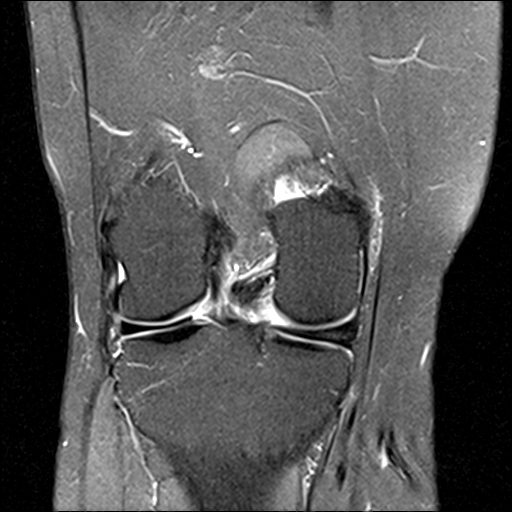
[im 23/28]
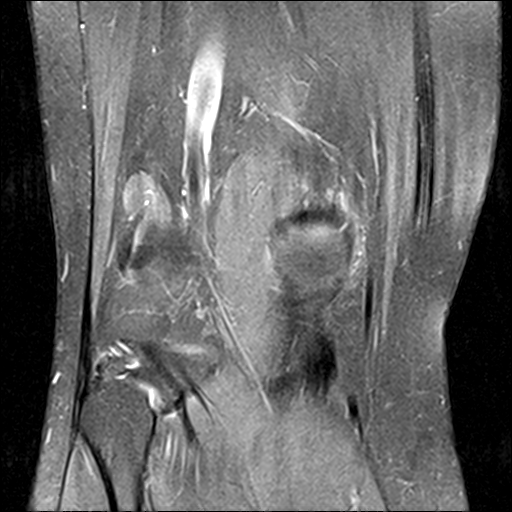
[im 28/28]
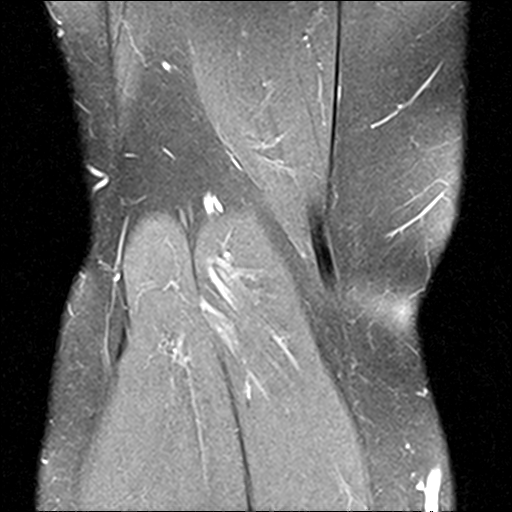

[Series 7: PD fat-sat · sagittal · 3.0mm · 0.29mm/px · 6 of 30 slices shown (2 of 2)]
[im 1/30]
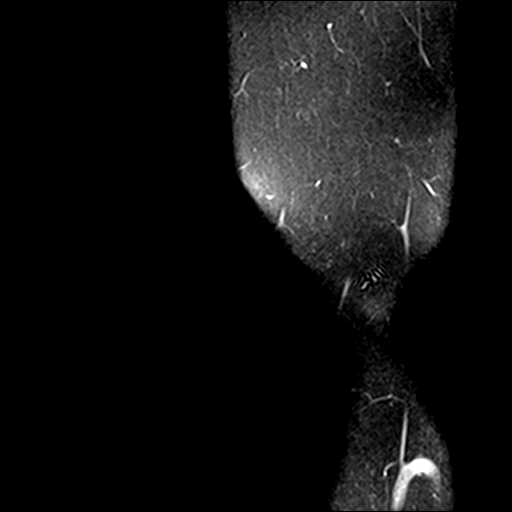
[im 5/30]
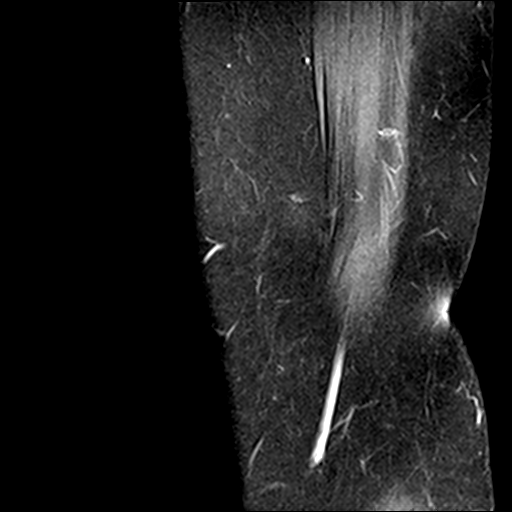
[im 9/30]
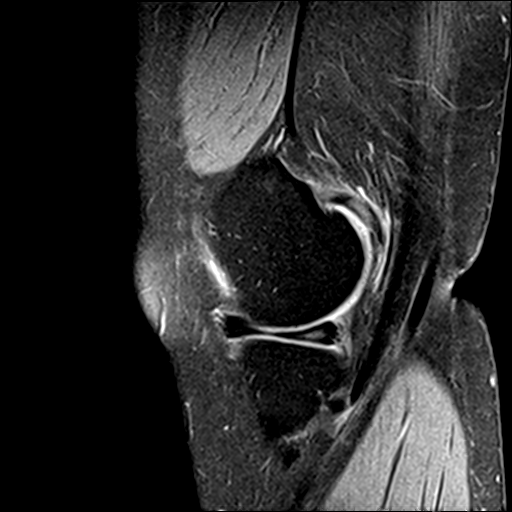
[im 13/30]
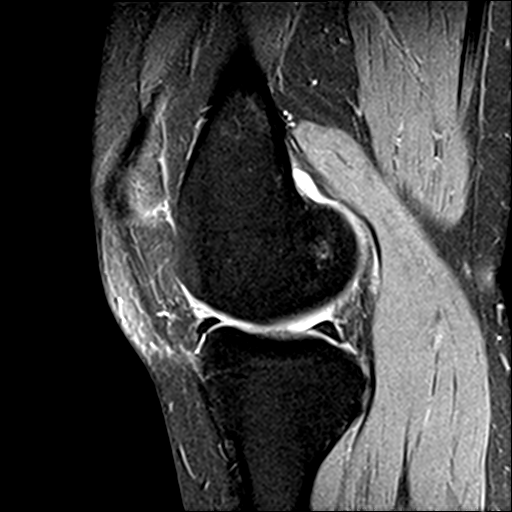
[im 17/30]
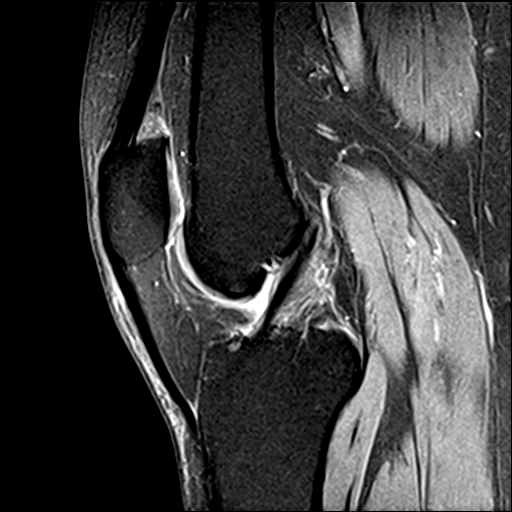
[im 25/30]
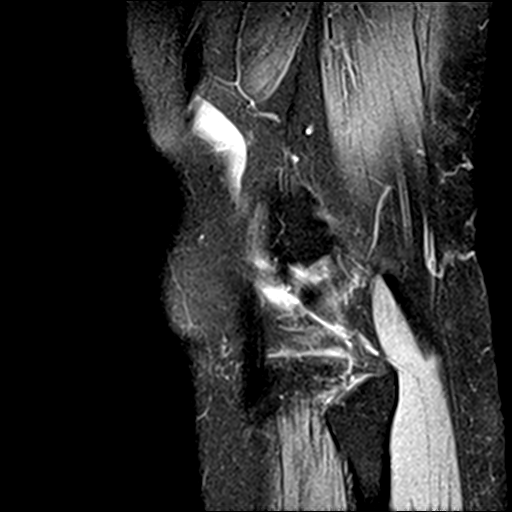

[19 of 40 positions shown; findings below may reference images not displayed]

FINDINGS: MENISCI

Medial meniscus: Intact. There is some intrasubstance degenerative
signal in the posterior horn.

Lateral meniscus:  Intact.  Complete discoid configuration noted.

LIGAMENTS

Cruciates:  Intact.

Collaterals:  Intact.

CARTILAGE

Patellofemoral:  Normal.

Medial:  Normal.

Lateral:  Normal.

Joint:  Trace amount of joint fluid.

Popliteal Fossa:  No Baker's cyst.

Extensor Mechanism:  Intact.

Bones:  Normal marrow signal throughout.

Other: None.
IMPRESSION: Negative for meniscal or ligament tear. Mild intrasubstance
degenerative signal change posterior horn medial meniscus.

Discoid lateral meniscus.

## 2019-12-16 ENCOUNTER — Other Ambulatory Visit: Payer: Medicaid Other

## 2019-12-18 ENCOUNTER — Encounter: Payer: Self-pay | Admitting: Orthopaedic Surgery

## 2019-12-18 ENCOUNTER — Ambulatory Visit (INDEPENDENT_AMBULATORY_CARE_PROVIDER_SITE_OTHER): Payer: Medicaid Other | Admitting: Orthopaedic Surgery

## 2019-12-18 DIAGNOSIS — M25561 Pain in right knee: Secondary | ICD-10-CM

## 2019-12-18 DIAGNOSIS — M25562 Pain in left knee: Secondary | ICD-10-CM

## 2019-12-18 DIAGNOSIS — G8929 Other chronic pain: Secondary | ICD-10-CM

## 2019-12-18 NOTE — Progress Notes (Signed)
And is a 49 year old female who comes for follow-up after having a MRI of her right knee due to chronic knee pain and the failure of conservative treatment.  She is scheduled to have some type of vein surgery coming up to relieve pain from varicose veins.  The right knee is examined today and shows no swelling at all.  It is ligamentously stable with full range of motion.  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  Her cartilage is intact throughout the knee.  There is only some slight signal changes in the posterior horn the meniscus but she is only painful on the lateral aspect of her knee at the IT band.  There is no effusion today and no Baker's cyst.  The only thing that she can work on with her knees in terms of to strengthen the quad muscles that will help her knees overall.  I recommended supplements such as glucosamine or turmeric to try.  All question concerns were answered and addressed.  Follow-up for the knees can be as needed.

## 2020-02-04 ENCOUNTER — Ambulatory Visit: Payer: No Typology Code available for payment source | Admitting: Allergy and Immunology

## 2020-02-07 DIAGNOSIS — G4733 Obstructive sleep apnea (adult) (pediatric): Secondary | ICD-10-CM | POA: Insufficient documentation

## 2020-03-06 ENCOUNTER — Other Ambulatory Visit: Payer: Self-pay

## 2020-03-06 MED ORDER — FLUTICASONE PROPIONATE 50 MCG/ACT NA SUSP: 2.0000 | Freq: Every day | NASAL | 5 refills | Status: AC | PRN

## 2020-03-06 MED ORDER — AZELASTINE HCL 0.1 % NA SOLN: 2.0000 | Freq: Two times a day (BID) | NASAL | 5 refills | Status: AC

## 2020-03-13 DIAGNOSIS — I824Y2 Acute embolism and thrombosis of unspecified deep veins of left proximal lower extremity: Secondary | ICD-10-CM | POA: Insufficient documentation

## 2020-03-13 DIAGNOSIS — F411 Generalized anxiety disorder: Secondary | ICD-10-CM | POA: Insufficient documentation

## 2020-03-30 DIAGNOSIS — D638 Anemia in other chronic diseases classified elsewhere: Secondary | ICD-10-CM | POA: Insufficient documentation

## 2020-03-30 DIAGNOSIS — Z8616 Personal history of COVID-19: Secondary | ICD-10-CM | POA: Insufficient documentation

## 2020-05-26 ENCOUNTER — Other Ambulatory Visit: Payer: Self-pay

## 2020-05-26 ENCOUNTER — Ambulatory Visit (INDEPENDENT_AMBULATORY_CARE_PROVIDER_SITE_OTHER): Payer: Medicaid Other | Admitting: Orthopaedic Surgery

## 2020-05-26 DIAGNOSIS — M25562 Pain in left knee: Secondary | ICD-10-CM | POA: Diagnosis not present

## 2020-05-26 DIAGNOSIS — G8929 Other chronic pain: Secondary | ICD-10-CM | POA: Diagnosis not present

## 2020-05-26 DIAGNOSIS — M25561 Pain in right knee: Secondary | ICD-10-CM

## 2020-05-26 MED ORDER — METHYLPREDNISOLONE ACETATE 40 MG/ML IJ SUSP
40.0000 mg | INTRAMUSCULAR | Status: AC | PRN
  Administered 2020-05-26: 40 mg via INTRA_ARTICULAR

## 2020-05-26 MED ORDER — LIDOCAINE HCL 1 % IJ SOLN
3.0000 mL | INTRAMUSCULAR | Status: AC | PRN
  Administered 2020-05-26: 3 mL

## 2020-05-26 MED ORDER — LIDOCAINE HCL 1 % IJ SOLN
3.0000 mL | INTRAMUSCULAR | Status: AC | PRN
Start: 2020-05-26 — End: 2020-05-26
  Administered 2020-05-26: 3 mL

## 2020-05-26 NOTE — Progress Notes (Signed)
Office Visit Note   Patient: Kristine Zimmerman           Date of Birth: 1970-08-04           MRN: [id number] Visit Date: 05/26/2020              Requested by: Kateri Mc, MD [address] [place],  [place] [address] PCP: Kateri Mc, MD   Assessment & Plan: Visit Diagnoses:  1. Chronic pain of right knee   2. Chronic pain of left knee     Plan: Given the fact that she is not a diabetic and cannot take anti-inflammatories I felt that it is worthwhile to try steroid injections in both knees today.  She agrees with this treatment plan and did tolerate them well.  There is really nothing else that I have to offer other than continued activity modification and quad strengthening exercises.  Follow-up can be as needed.  Follow-Up Instructions: Return if symptoms worsen or fail to improve.   Orders:  Orders Placed This Encounter  Procedures  . Large Joint Inj  . Large Joint Inj   No orders of the defined types were placed in this encounter.     Procedures: Large Joint Inj: R knee on 05/26/2020 11:03 AM Indications: diagnostic evaluation and pain Details: 22 G 1.5 in needle, superolateral approach  Arthrogram: No  Medications: 3 mL lidocaine 1 %; 40 mg methylPREDNISolone acetate 40 MG/ML Outcome: tolerated well, no immediate complications Procedure, treatment alternatives, risks and benefits explained, specific risks discussed. Consent was given by the patient. Immediately prior to procedure a time out was called to verify the correct patient, procedure, equipment, support staff and site/side marked as required. Patient was prepped and draped in the usual sterile fashion.   Large Joint Inj: L knee on 05/26/2020 11:03 AM Indications: diagnostic evaluation and pain Details: 22 G 1.5 in needle, superolateral approach  Arthrogram: No  Medications: 3 mL lidocaine 1 %; 40 mg methylPREDNISolone acetate 40 MG/ML Outcome: tolerated well, no immediate complications Procedure,  treatment alternatives, risks and benefits explained, specific risks discussed. Consent was given by the patient. Immediately prior to procedure a time out was called to verify the correct patient, procedure, equipment, support staff and site/side marked as required. Patient was prepped and draped in the usual sterile fashion.       Clinical Data: No additional findings.   Subjective: Chief Complaint  Patient presents with  . Right Knee - Pain  . Left Knee - Pain  . Lower Back - Pain  The patient comes in today with chronic bilateral knee pain.  I seen her for her knees before and her x-rays were unremarkable.  We did obtain MRI of the right knee last year showing no cartilage irregularities in the knee and just a discoid variant meniscus.  She is plagued by bilateral lower extremity chronic varicose veins which can cause pain.  She unfortunately was hospitalized for COVID-19 at the end of this year.  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  She has been slowly weaning off of oxygen requirements as it relates to her hospitalization from COVID-19.  She had to go through extensive physical therapy for 4 weeks due to deconditioning.  Both knees are sore.  She is not diabetic.  HPI  Review of Systems Today she denies any fever, chills, nausea, vomiting she does deny chest pain today.  Objective: Vital Signs: LMP 03/29/2017   Physical Exam She is alert and oriented x3 and in no acute distress.  She is very slow to mobilize. Ortho Exam Both knees were examined today and show some patellofemoral crepitation but otherwise no effusion and good range of motion but just global tenderness. Specialty Comments:  No specialty comments available.  Imaging: No results found.   PMFS History: Patient Active Problem List   Diagnosis Date Noted  . Local reactions to insect stings 10/04/2019  . Coughing/wheezing 10/04/2019  .  Adverse drug reaction 10/04/2019  . Third degree laceration of perineum, type 3c 07/16/2019  . Neck pain 05/12/2017  . IBS (irritable bowel syndrome) 04/14/2017  . Overweight (BMI 25.0-29.9) 04/14/2017  . Right shoulder pain 04/14/2017  . Chronic rhinitis 03/29/2016  . Diverticulosis 03/29/2016  . History of pituitary tumor 03/29/2016  . Hypothyroidism 03/29/2016  . Vertigo 03/29/2016  . Vestibular migraine 03/29/2016   Past Medical History:  Diagnosis Date  . Angio-edema   . Anxiety   . Brain tumor (benign) ([facility])   . Chronic right shoulder pain   . Depression   . Diverticulitis   . Diverticulosis   . Eczema   . GERD (gastroesophageal reflux disease)   . Hypertension   . IBS (irritable bowel syndrome)   . Ileus, postoperative ([facility]) 02/2019  . Migraines   . Mild intermittent asthma 10/04/2019  . Partial bowel obstruction ([facility]) 02/2019  . PONV (postoperative nausea and vomiting)    "Scopolamine patch helps a lot" per patient  . Pre-diabetes   . Seizures ([facility]) 1990   07/12/2019 - "due to brain tumor, which has since been removed and no seizures since 1999"  . Thyroid disease   . Urticaria   . Vertigo     Family History  Problem Relation Age of Onset  . Asthma Maternal Aunt   . Asthma Maternal Uncle   . Allergic rhinitis Neg Hx   . Eczema Neg Hx   . Urticaria Neg Hx     Past Surgical History:  Procedure Laterality Date  . ABDOMINAL HYSTERECTOMY    . ANTERIOR CERVICAL DECOMP/DISCECTOMY FUSION Left 07/16/2019   Procedure: ANTERIOR CERVICAL DECOMPRESSION FUSION CERVICAL SEVEN-THORACIC ONE. LEFT-SIDED APPROACH;  Surgeon: Kary Kos, MD;  Location: [facility];  Service: Neurosurgery;  Laterality: Left;  left-side approach  . BELPHAROPTOSIS REPAIR Left   . BRAIN SURGERY    . CERVICAL SPINE SURGERY  2020  . COLONOSCOPY WITH ESOPHAGOGASTRODUODENOSCOPY (EGD)    . COSMETIC SURGERY     Facial surgery due to dog bite  . ENDOMETRIAL ABLATION    . LAPAROSCOPIC LYSIS OF ADHESIONS   03/09/2019   with release of small bowel obstruction  . NASAL SINUS SURGERY    . OVARIAN CYST REMOVAL Right   . TYMPANOSTOMY TUBE PLACEMENT     Social History   Occupational History  . Not on file  Tobacco Use  . Smoking status: Never Smoker  . Smokeless tobacco: Never Used  Vaping Use  . Vaping Use: Never used  Substance and Sexual Activity  . Alcohol use: No  . Drug use: No  . Sexual activity: Yes    Birth control/protection: Surgical

## 2020-05-27 DIAGNOSIS — F339 Major depressive disorder, recurrent, unspecified: Secondary | ICD-10-CM | POA: Insufficient documentation

## 2020-06-23 ENCOUNTER — Ambulatory Visit (INDEPENDENT_AMBULATORY_CARE_PROVIDER_SITE_OTHER): Payer: Medicaid Other | Admitting: Physician Assistant

## 2020-06-23 ENCOUNTER — Ambulatory Visit (INDEPENDENT_AMBULATORY_CARE_PROVIDER_SITE_OTHER): Payer: Medicaid Other

## 2020-06-23 ENCOUNTER — Encounter: Payer: Self-pay | Admitting: Physician Assistant

## 2020-06-23 DIAGNOSIS — M7061 Trochanteric bursitis, right hip: Secondary | ICD-10-CM

## 2020-06-23 MED ORDER — LIDOCAINE HCL 1 % IJ SOLN
3.0000 mL | INTRAMUSCULAR | Status: AC | PRN
  Administered 2020-06-23: 3 mL

## 2020-06-23 MED ORDER — METHYLPREDNISOLONE ACETATE 40 MG/ML IJ SUSP
40.0000 mg | INTRAMUSCULAR | Status: AC | PRN
Start: 2020-06-23 — End: 2020-06-23
  Administered 2020-06-23: 40 mg via INTRA_ARTICULAR

## 2020-06-23 NOTE — Progress Notes (Addendum)
Office Visit Note   Patient: Kristine Zimmerman           Date of Birth: [birth year]           MRN: [id number] Visit Date: 06/23/2020              Requested by: Kateri Mc, MD [address] [place],  [place] [address] PCP: Kateri Mc, MD   Assessment & Plan: Visit Diagnoses:  1. Trochanteric bursitis of right hip     Plan: She shown IT band stretching exercises.  She will follow-up with [place] as needed if pain persist or becomes worse.  Questions encouraged and answered  Follow-Up Instructions: Return if symptoms worsen or fail to improve.   Orders:  Orders Placed This Encounter  Procedures  . Large Joint Inj  . XR HIP UNILAT W OR W/O PELVIS 2-3 VIEWS RIGHT   No orders of the defined types were placed in this encounter.     Procedures: Large Joint Inj: R greater trochanter on 06/23/2020 11:33 AM Indications: pain Details: 22 G 1.5 in needle, lateral approach  Arthrogram: No  Medications: 3 mL lidocaine 1 %; 40 mg methylPREDNISolone acetate 40 MG/ML Outcome: tolerated well, no immediate complications Procedure, treatment alternatives, risks and benefits explained, specific risks discussed. Consent was given by the patient. Immediately prior to procedure a time out was called to verify the correct patient, procedure, equipment, support staff and site/side marked as required. Patient was prepped and draped in the usual sterile fashion.       Clinical Data: No additional findings.   Subjective: Chief Complaint  Patient presents with  . Right Hip - Pain    HPI [name] 1 month bilateral knee injections, comes in today with right hip pain.  Pain is mostly right hip and right buttocks area.  She said no known injury.  History of sacral fracture in the past in 2019.  She is having no significant groin pain.  Has had pain in the right lateral hip when lying on the hip.  She is taking ibuprofen for the pain.  She is using a heating pad.  Occasionally uses a  cane. Review of Systems Denies any fevers or chills.  Also denies any known infection.  Objective: Vital Signs: LMP 03/29/2017   Physical Exam General: Well-developed well-nourished female no acute distress. Psych: Alert and oriented x3  Ortho Exam Bilateral hips good range of motion of both hips.  Discomfort with external rotation of the right hip.  Tenderness over the right trochanteric region.  No rashes skin lesions ulcerations over the lateral right hip. Specialty Comments:  No specialty comments available.  Imaging: XR HIP UNILAT W OR W/O PELVIS 2-3 VIEWS RIGHT  Result Date: 06/23/2020 AP pelvis lateral view of the right hip: Bilateral hips are well located.  No acute fractures or bony abnormalities.    PMFS History: Patient Active Problem List   Diagnosis Date Noted  . Local reactions to insect stings 10/04/2019  . Coughing/wheezing 10/04/2019  . Adverse drug reaction 10/04/2019  . Third degree laceration of perineum, type 3c 07/16/2019  . Neck pain 05/12/2017  . IBS (irritable bowel syndrome) 04/14/2017  . Overweight (BMI 25.0-29.9) 04/14/2017  . Right shoulder pain 04/14/2017  . Chronic rhinitis 03/29/2016  . Diverticulosis 03/29/2016  . History of pituitary tumor 03/29/2016  . Hypothyroidism 03/29/2016  . Vertigo 03/29/2016  . Vestibular migraine 03/29/2016   Past Medical History:  Diagnosis Date  . Angio-edema   .  Anxiety   . Brain tumor (benign) ([facility])   . Chronic right shoulder pain   . Depression   . Diverticulitis   . Diverticulosis   . Eczema   . GERD (gastroesophageal reflux disease)   . Hypertension   . IBS (irritable bowel syndrome)   . Ileus, postoperative ([facility]) 02/2019  . Migraines   . Mild intermittent asthma 10/04/2019  . Partial bowel obstruction ([facility]) 02/2019  . PONV (postoperative nausea and vomiting)    "Scopolamine patch helps a lot" per patient  . Pre-diabetes   . Seizures ([facility]) 1990   07/12/2019 - "due to brain tumor, which has  since been removed and no seizures since 1999"  . Thyroid disease   . Urticaria   . Vertigo     Family History  Problem Relation Age of Onset  . Asthma Maternal Aunt   . Asthma Maternal Uncle   . Allergic rhinitis Neg Hx   . Eczema Neg Hx   . Urticaria Neg Hx     Past Surgical History:  Procedure Laterality Date  . ABDOMINAL HYSTERECTOMY    . ANTERIOR CERVICAL DECOMP/DISCECTOMY FUSION Left 07/16/2019   Procedure: ANTERIOR CERVICAL DECOMPRESSION FUSION CERVICAL SEVEN-THORACIC ONE. LEFT-SIDED APPROACH;  Surgeon: Kary Kos, MD;  Location: [facility];  Service: Neurosurgery;  Laterality: Left;  left-side approach  . BELPHAROPTOSIS REPAIR Left   . BRAIN SURGERY    . CERVICAL SPINE SURGERY  2020  . COLONOSCOPY WITH ESOPHAGOGASTRODUODENOSCOPY (EGD)    . COSMETIC SURGERY     Facial surgery due to dog bite  . ENDOMETRIAL ABLATION    . LAPAROSCOPIC LYSIS OF ADHESIONS  03/09/2019   with release of small bowel obstruction  . NASAL SINUS SURGERY    . OVARIAN CYST REMOVAL Right   . TYMPANOSTOMY TUBE PLACEMENT     Social History   Occupational History  . Not on file  Tobacco Use  . Smoking status: Never Smoker  . Smokeless tobacco: Never Used  Vaping Use  . Vaping Use: Never used  Substance and Sexual Activity  . Alcohol use: No  . Drug use: No  . Sexual activity: Yes    Birth control/protection: Surgical

## 2020-06-29 DIAGNOSIS — R000 Tachycardia, unspecified: Secondary | ICD-10-CM | POA: Insufficient documentation

## 2020-06-29 DIAGNOSIS — R0602 Shortness of breath: Secondary | ICD-10-CM | POA: Insufficient documentation

## 2020-06-29 DIAGNOSIS — Z789 Other specified health status: Secondary | ICD-10-CM | POA: Insufficient documentation

## 2020-07-01 DIAGNOSIS — I058 Other rheumatic mitral valve diseases: Secondary | ICD-10-CM | POA: Insufficient documentation

## 2020-07-07 ENCOUNTER — Ambulatory Visit: Payer: Medicaid Other | Admitting: Physician Assistant

## 2020-07-09 ENCOUNTER — Encounter: Payer: Self-pay | Admitting: Physician Assistant

## 2020-07-09 ENCOUNTER — Ambulatory Visit (INDEPENDENT_AMBULATORY_CARE_PROVIDER_SITE_OTHER): Payer: Medicaid Other | Admitting: Physician Assistant

## 2020-07-09 DIAGNOSIS — M7061 Trochanteric bursitis, right hip: Secondary | ICD-10-CM

## 2020-07-09 NOTE — Progress Notes (Signed)
HPI: [name] returns today follow-up of her right hip bursitis.  She states the injection on 06/23/2020 helped.  She does feel that she has some tightness and has pain lateral aspect of the hip and down the leg to the knee when she is putting on shoes.  She been doing a lot of driving lately and has had pain lateral aspect of the hip down the knee no numbness tingling.  Have no back pain.  She has had no calf pain on the right.  She does have a known DVT in the left leg which she is on Eliquis for.  Review of systems see HPI otherwise negative  Physical exam: Right hip good range of motion without pain.  She has tenderness over the right trochanteric region and down the IT band.  Right calf supple nontender.  Impression: Right hip IT band syndrome  Plan: Recommend she continue to work on IT band stretching exercises.  She was amendable to going to physical therapy for stretching of her IT band modalities and home exercise program prescription was written.  She did ask about a tick bite which she has on her left buttocks region.  This area is erythematous.  There is no signs of gross infection.  She is currently on doxycycline.  She does have the take in a plastic bag I asked her to follow-up with her primary care physician to see what type of testing may need to be done on the uptake and also any type of labs needed to be performed for the patient.  She will follow-up as needed.  Questions encouraged and answered

## 2020-07-23 ENCOUNTER — Other Ambulatory Visit: Payer: Self-pay | Admitting: Neurosurgery

## 2020-07-23 DIAGNOSIS — G8194 Hemiplegia, unspecified affecting left nondominant side: Secondary | ICD-10-CM

## 2020-07-23 DIAGNOSIS — M542 Cervicalgia: Secondary | ICD-10-CM

## 2020-07-30 ENCOUNTER — Ambulatory Visit
Admission: RE | Admit: 2020-07-30 | Discharge: 2020-07-30 | Disposition: A | Discharge status: Home / Self Care | Payer: Medicaid Other | Source: Ambulatory Visit | Attending: Neurosurgery | Admitting: Neurosurgery

## 2020-07-30 DIAGNOSIS — G8194 Hemiplegia, unspecified affecting left nondominant side: Secondary | ICD-10-CM

## 2020-07-30 DIAGNOSIS — M542 Cervicalgia: Secondary | ICD-10-CM

## 2020-07-30 IMAGING — CT CT CERVICAL SPINE W/O CM
3 of 4 series · 13 of 33 positions shown, 16 images · non-contrast
Comparison: MRI cervical spine [DATE].

CLINICAL DATA: Cervicalgia, left hemiparesis. Pain pain down right
shoulder.

EXAM:
CT CERVICAL SPINE WITHOUT CONTRAST
TECHNIQUE: Multidetector CT imaging of the cervical spine was performed without
intravenous contrast. Multiplanar CT image reconstructions were also
generated.

[Series 4: c-spine 2.00 br44 s3 axial soft (person_name) · axial · 0.39mm/px · z∈[-763,-627]mm · 5 of 104 slices shown, 7 images]
[im 18/104  soft-tissue]
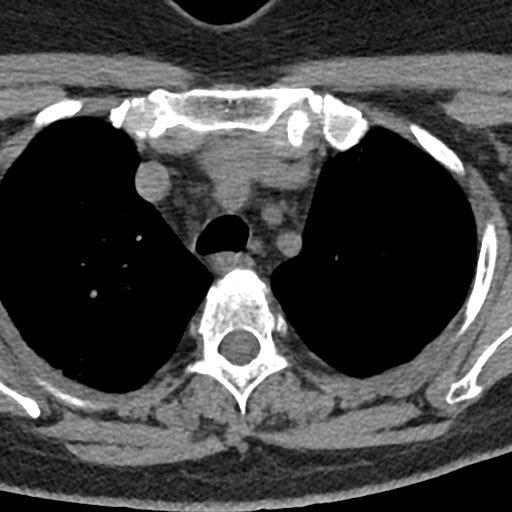
[im 18/104  bone]
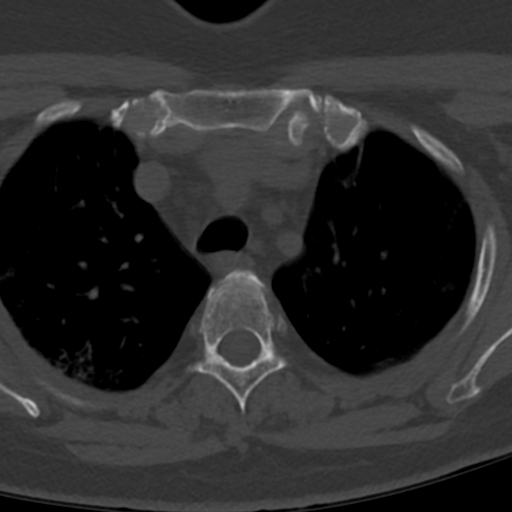
[im 35/104  bone]
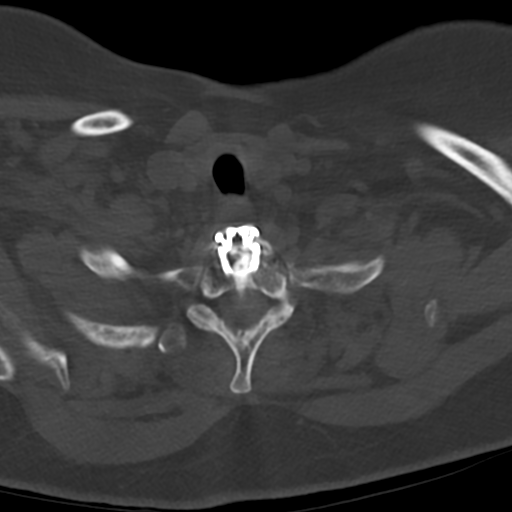
[im 52/104  bone]
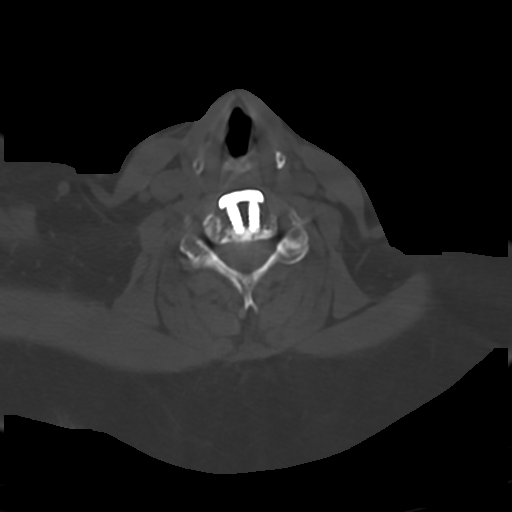
[im 69/104  bone]
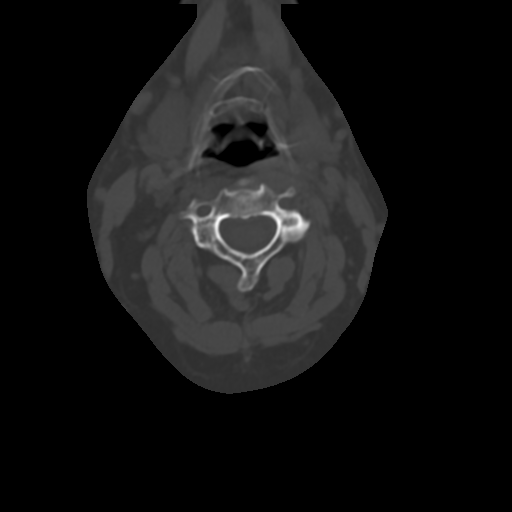
[im 86/104  soft-tissue]
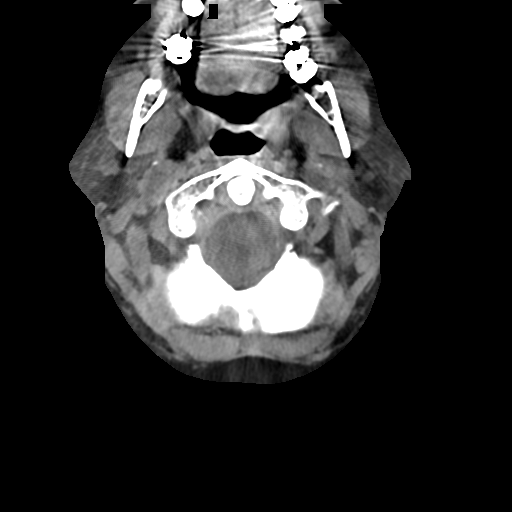
[im 86/104  bone]
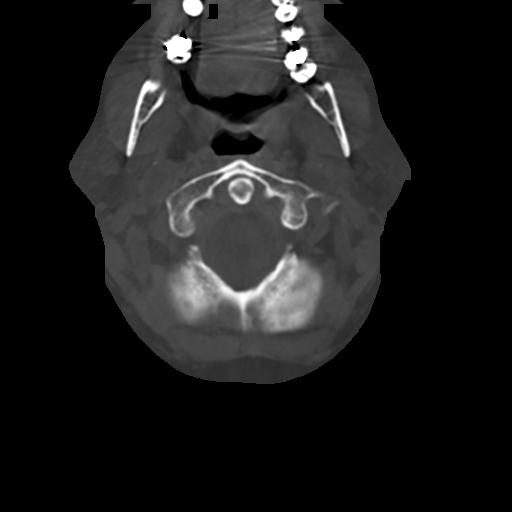

[Series 5: c-spine 2.00 br60 s3 sag bone · sagittal · 0.39mm/px · 5 of 99 slices shown, 6 images]
[im 33/99  bone]
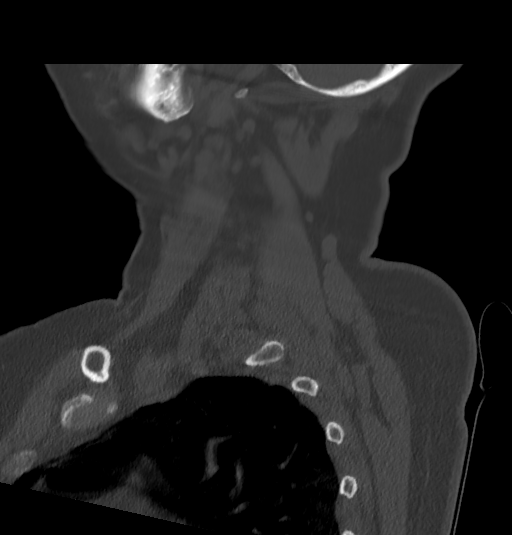
[im 41/99  bone]
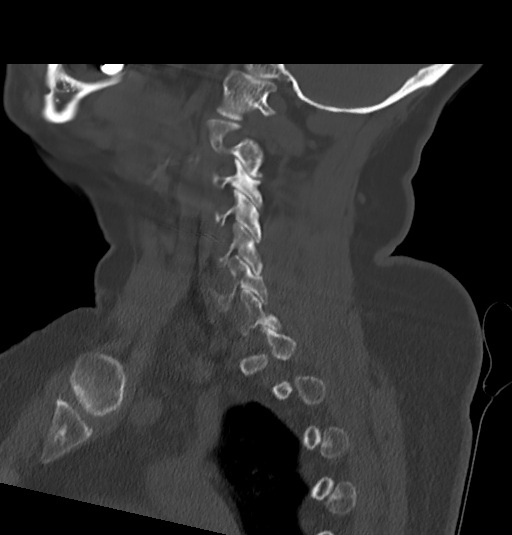
[im 50/99  soft-tissue]
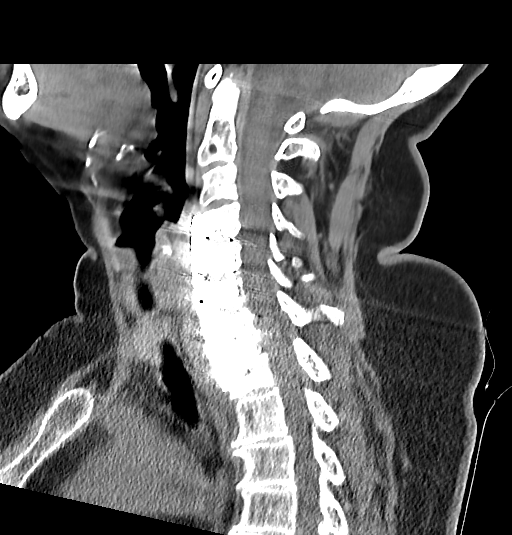
[im 50/99  bone]
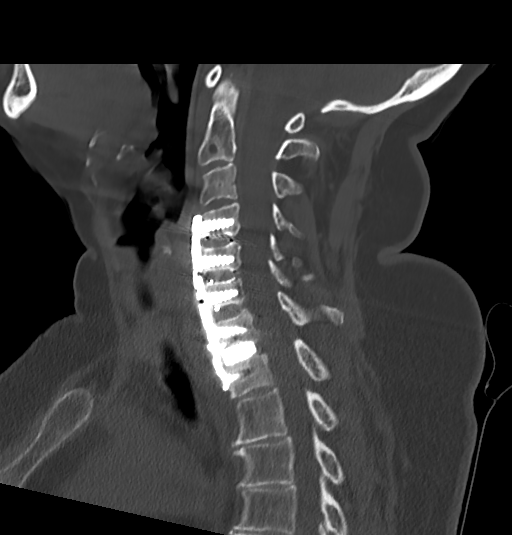
[im 58/99  bone]
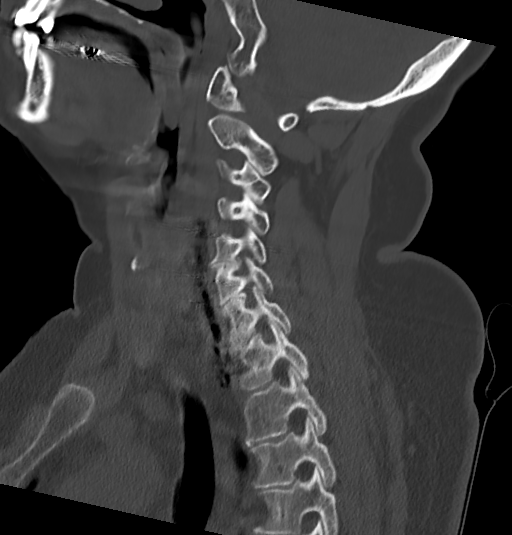
[im 66/99  bone]
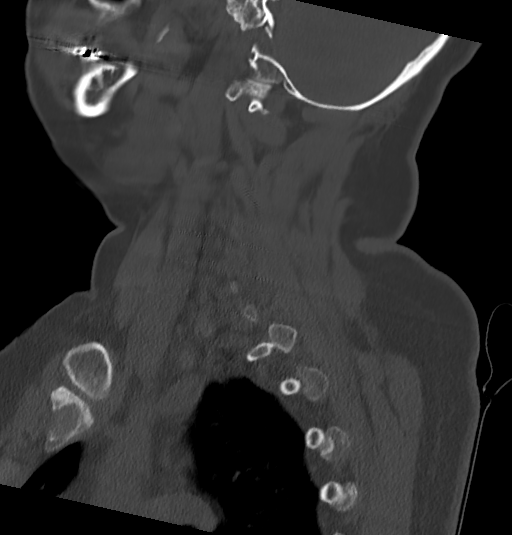

[Series 7: c-spine 2.00 hr60 s3 cor bone · coronal · 0.39mm/px · 3 of 99 slices shown]
[im 20/99  bone]
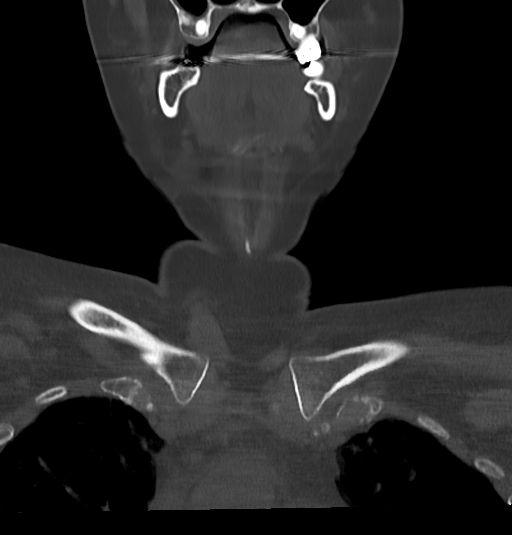
[im 40/99  bone]
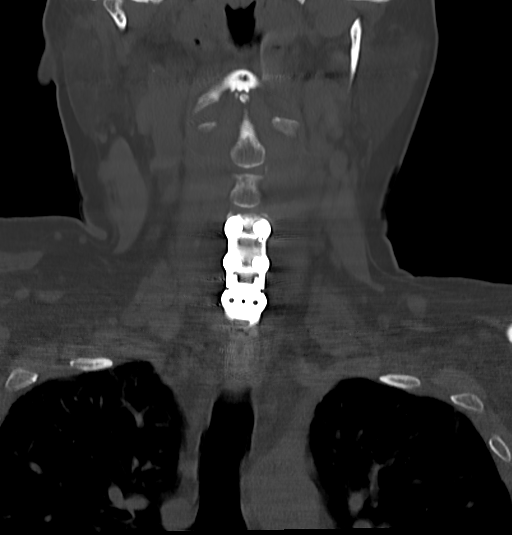
[im 59/99  bone]
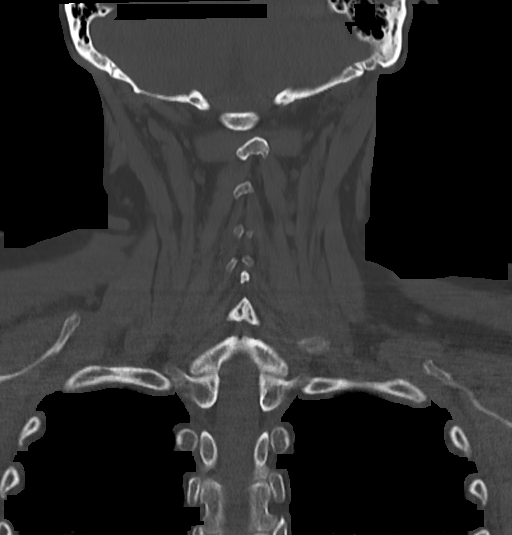

[13 of 33 positions shown; findings below may reference images not displayed]

FINDINGS: Alignment: No substantial sagittal subluxation.

Skull base and vertebrae: ACDF spanning C4 through T1 intervening
interbody spacers. There is evidence of some bony fusion across the
disc spaces at every level. The right T1 screw is slightly (2 mm)
proud. No evidence of hardware fracture. No lucency surrounding the
screws to suggest loosening. No evidence of acute fracture.

Soft tissues and spinal canal: No prevertebral fluid or swelling. No
visible canal hematoma.

Disc levels:

C2-C3: No evidence of significant bony canal or foraminal stenosis.

C3-C4: Mild right facet and uncovertebral hypertrophy with suspected
mild right foraminal stenosis. Small posterior disc without evidence
of significant canal stenosis.

C4-C5: ACDF. Streak artifact limits evaluation without evidence of
significant bony canal or foraminal stenosis.

C5-C6: ACDF. Streak artifact limits evaluation. Right-sided
facet/uncovertebral hypertrophy with suspected mild-to-moderate
right foraminal stenosis. No significant bony canal stenosis.

C6-C7: ACDF. Streak artifact limits evaluation. Bilateral facet
uncovertebral hypertrophy with suspected mild to moderate right and
mild left foraminal stenosis. No significant bony canal stenosis.

C7-T1: ACDF.  Patent canal and foramina.

T1-T2: No evidence of significant canal or foraminal stenosis. No
evidence

Upper chest: Mild Tree-in-bud opacities in the dependent right upper
lobe
IMPRESSION: 1. No evidence of of acute fracture or new malalignment.
2. C4-T1 ACDF with evidence of some bony fusion across the disc
spaces. The right T1 screw is slightly (2 mm) proud. It is unclear
if this is how the screw was placed or if this screw has slightly
backed out given no cross-sectional imaging since the surgery.
Otherwise, no evidence of hardware complication.
3. Evaluation of the canal/foramina is limited without contrast and
given streak artifact from hardware with suspected at least mild to
moderate foraminal stenosis on the right at C5-C6 and C6-C7 and mild
foraminal stenosis on the right at C3-C4 and left at C6-C7. No
significant bony canal stenosis. An MRI or CT myelogram could better
characterize the canal/cord/foramina if clinically indicated.
4. Mild t of FUERTE opacities in the dependent right upper lobe,
suspicious for aspiration or pneumonia.

## 2020-07-30 IMAGING — CT CT HEAD W/O CM
4 series · 17 of 47 positions shown, 19 images · non-contrast
Comparison: CT head [DATE].

CLINICAL DATA: Cervicalgia, left hemiparesis, pain down right
shoulder.

EXAM:
CT HEAD WITHOUT CONTRAST
TECHNIQUE: Contiguous axial images were obtained from the base of the skull
through the vertex without intravenous contrast.

[Series 2: head 5.00 hr40 s3 axial ibhc · axial · 0.42mm/px · z∈[-610,-500]mm · 6 of 32 slices shown, 8 images]
[im 5/32  brain]
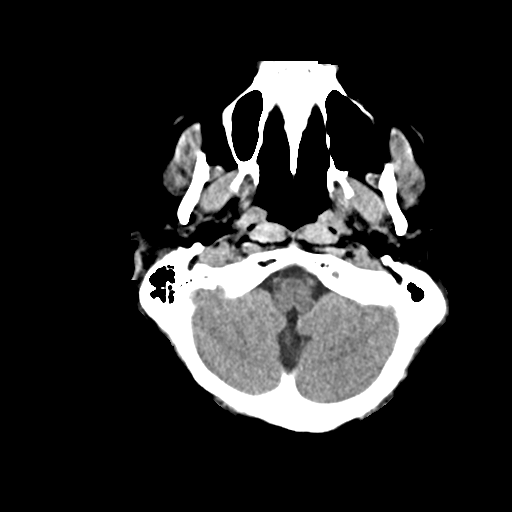
[im 5/32  bone]
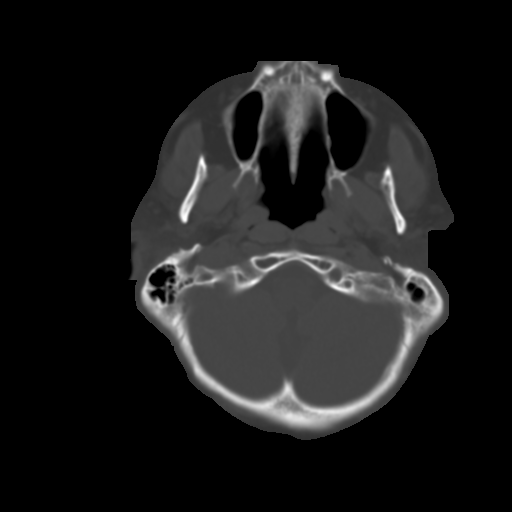
[im 9/32  brain]
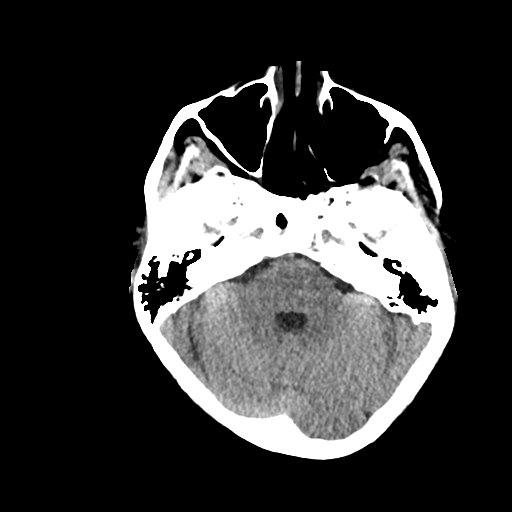
[im 14/32  brain]
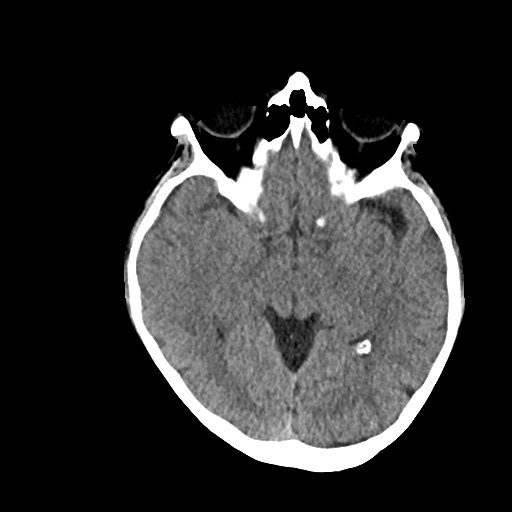
[im 18/32  brain]
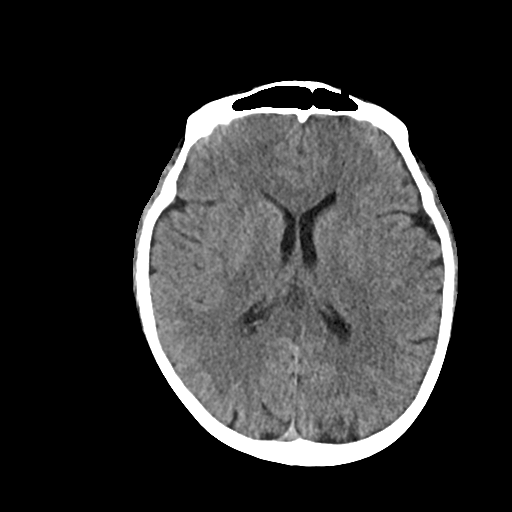
[im 23/32  brain]
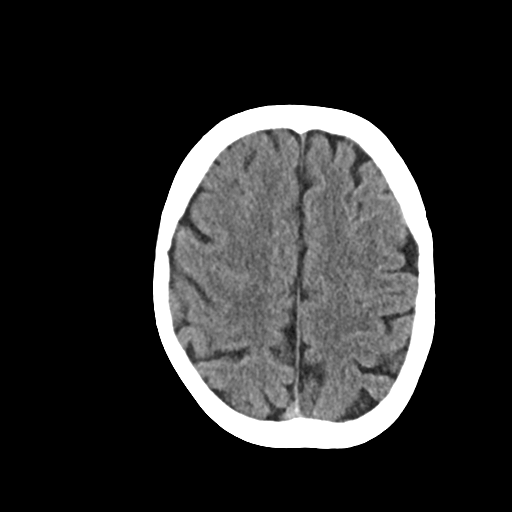
[im 23/32  bone]
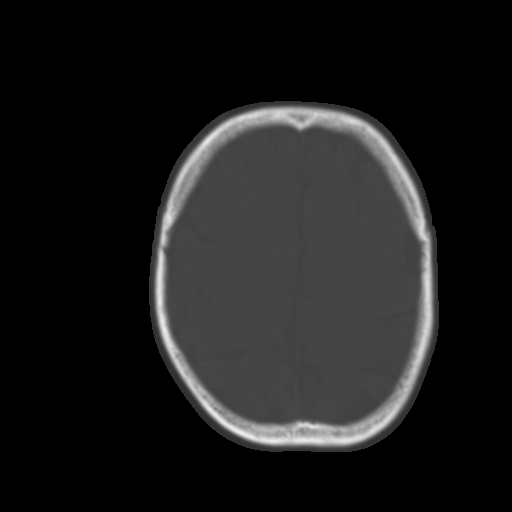
[im 27/32  brain]
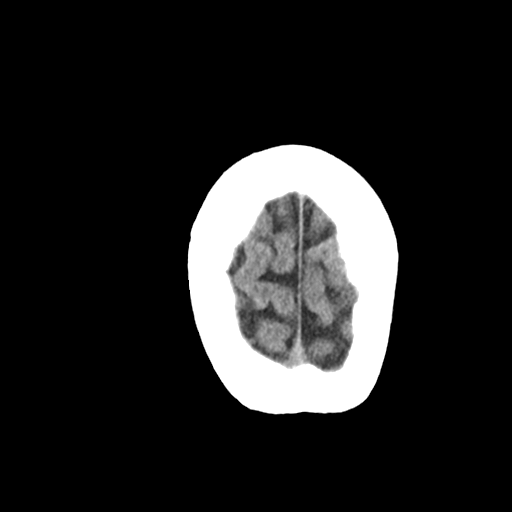

[Series 3: head 2.00 hr60 s3 axial bone · axial · 0.42mm/px · z∈[-617,-541]mm · 5 of 81 slices shown]
[im 8/81  bone]
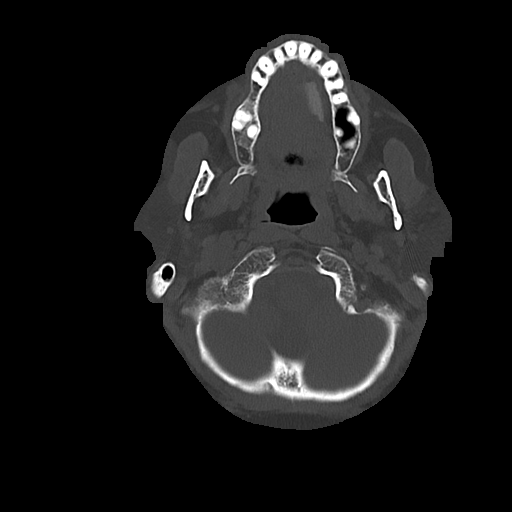
[im 16/81  bone]
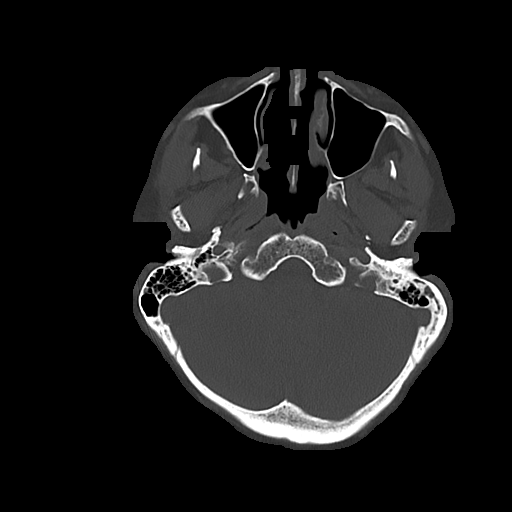
[im 27/81  bone]
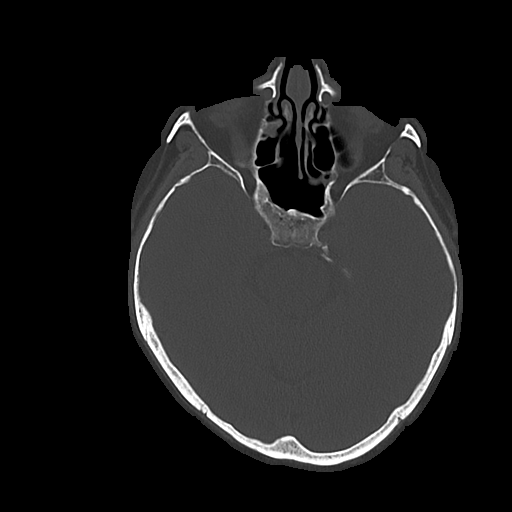
[im 35/81  bone]
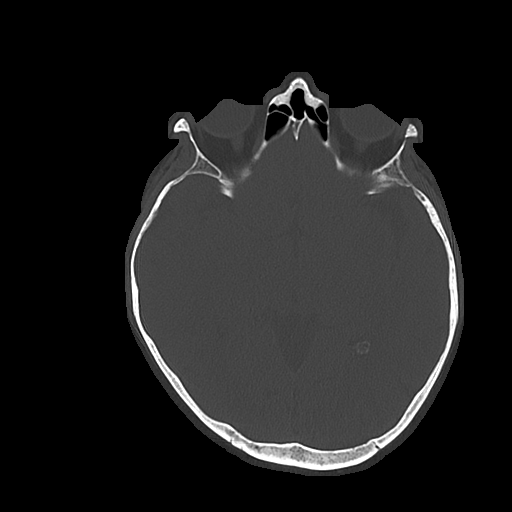
[im 46/81  bone]
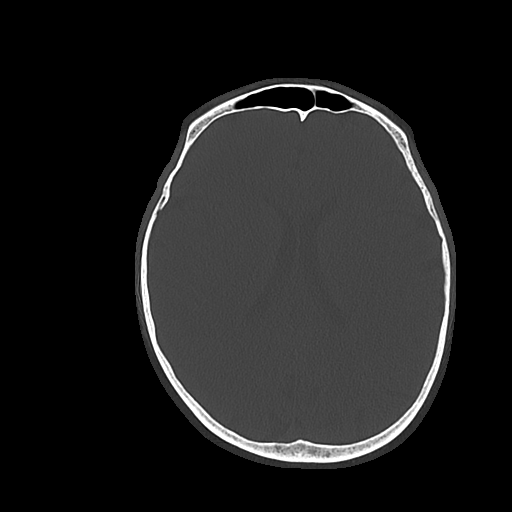

[Series 4: head 3.00 hr40 s3 sag · sagittal · 0.31mm/px · 3 of 56 slices shown]
[im 19/56  brain]
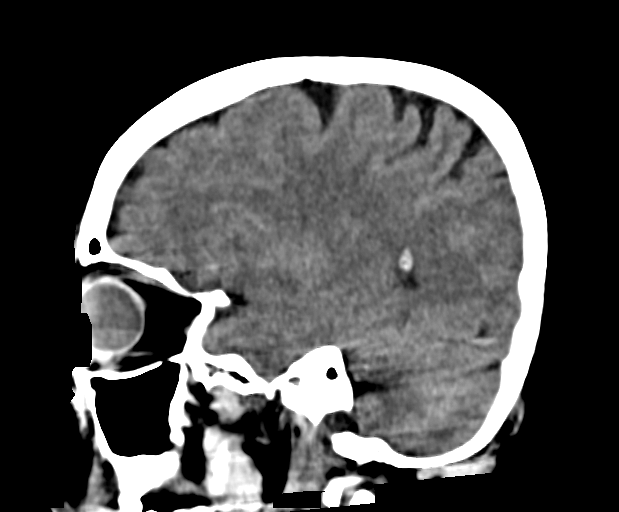
[im 28/56  brain]
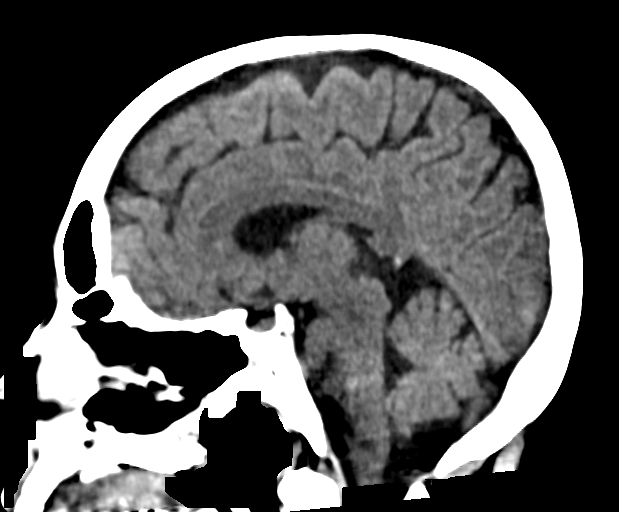
[im 37/56  brain]
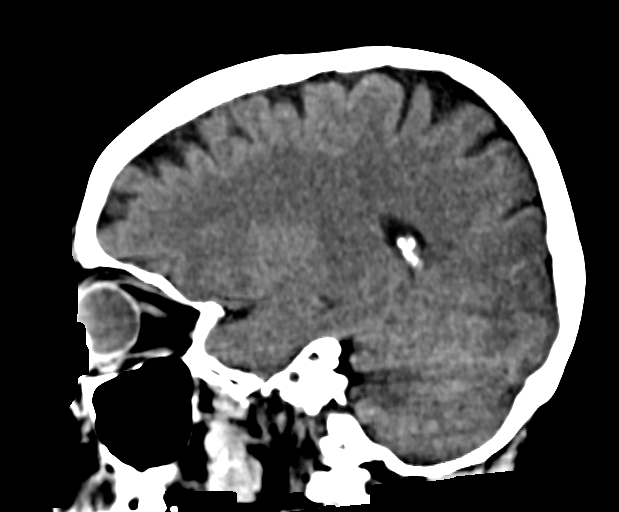

[Series 6: head 3.00 hr40 s3 cor · coronal · 0.31mm/px · 3 of 64 slices shown]
[im 22/64  brain]
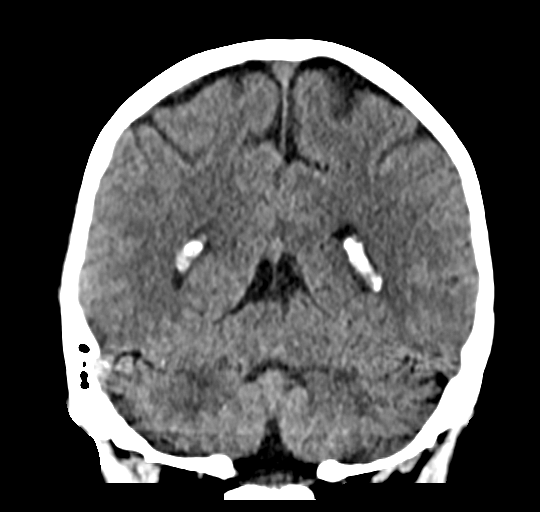
[im 29/64  brain]
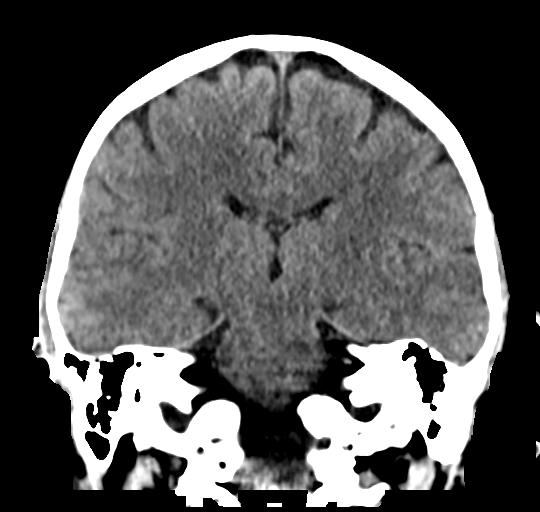
[im 36/64  brain]
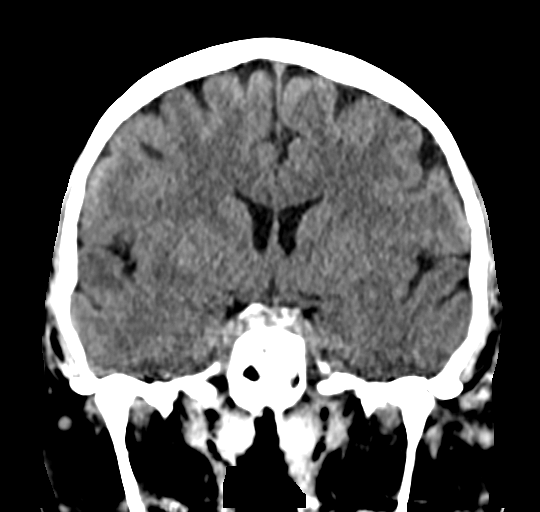

[17 of 47 positions shown; findings below may reference images not displayed]

FINDINGS: Brain: No evidence of acute large vascular territory infarction,
hemorrhage, hydrocephalus, extra-axial collection or mass
lesion/mass effect.

Vascular: No hyperdense vessel identified.

Skull: No acute fracture.

Sinuses/Orbits: Evidence of prior endoscopic sinus surgery with
sphenoidectomy a, partial ethmoidectomy and right turbinectomies.
Mild mucosal thickening without air-fluid levels.

Other: No mastoid effusions.
IMPRESSION: 1. Normal head CT.  No evidence of acute abnormality.
2. Prior endoscopic sinus surgery.

## 2020-08-04 ENCOUNTER — Telehealth: Payer: Self-pay | Admitting: Physician Assistant

## 2020-08-04 ENCOUNTER — Ambulatory Visit: Payer: Medicaid Other | Admitting: Physician Assistant

## 2020-08-04 NOTE — Telephone Encounter (Signed)
Pt called and wanted to let Artis Delay know that she apologized for missing their appt this morning. Pt's sister had a mild stroke over the weekend (along with other family situations) and did not notice she had missed the appt until just now and wanted to make sure he knew she resch'd for 08/14/20. Pt's call back number is [phone] if there are any questions.

## 2020-08-14 ENCOUNTER — Encounter: Payer: Self-pay | Admitting: Physician Assistant

## 2020-08-14 ENCOUNTER — Other Ambulatory Visit: Payer: Self-pay

## 2020-08-14 ENCOUNTER — Ambulatory Visit (INDEPENDENT_AMBULATORY_CARE_PROVIDER_SITE_OTHER): Payer: Medicaid Other

## 2020-08-14 ENCOUNTER — Ambulatory Visit (INDEPENDENT_AMBULATORY_CARE_PROVIDER_SITE_OTHER): Payer: Medicaid Other | Admitting: Physician Assistant

## 2020-08-14 DIAGNOSIS — M5416 Radiculopathy, lumbar region: Secondary | ICD-10-CM | POA: Diagnosis not present

## 2020-08-14 DIAGNOSIS — M5440 Lumbago with sciatica, unspecified side: Secondary | ICD-10-CM

## 2020-08-14 MED ORDER — METHOCARBAMOL 500 MG PO TABS: 500.0000 mg | ORAL_TABLET | Freq: Every day | ORAL | 1 refills | Status: AC

## 2020-08-14 NOTE — Progress Notes (Signed)
Office Visit Note   Patient: Kristine Zimmerman           Date of Birth: Oct 22, 1970           MRN: [id number] Visit Date: 08/14/2020              Requested by: Kateri Mc, MD [address] [place],  [place] [address] PCP: Kateri Mc, MD   Assessment & Plan: Visit Diagnoses:  1. Low back pain with sciatica, sciatica laterality unspecified, unspecified back pain laterality, unspecified chronicity   2. Acute radicular low back pain     Plan: Given patient's waking pain due to her back pain and the new onset of increased urinary frequency and incontinence recommend MRI.  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  She will also contact her primary care physician to get checked for possible UTI.  We will see her back after the MRI to go over results and discuss further treatment.  Follow-Up Instructions: Return After MRI.   Orders:  Orders Placed This Encounter  Procedures   XR Lumbar Spine 2-3 Views   Meds ordered this encounter  Medications   methocarbamol (ROBAXIN) 500 MG tablet    Sig: Take 1 tablet (500 mg total) by mouth at bedtime.    Dispense:  30 tablet    Refill:  1      Procedures: No procedures performed   Clinical Data: No additional findings.   Subjective: Chief Complaint  Patient presents with   Right Hip - Follow-up, Pain    7 weeks f/u for right hip trochanteric bursitis/IT band syndrome    HPI [name] returns today 7 weeks status post trochanteric injection.  She states the lateral hip is still very sore.  She is now having some pain down the right leg to the knee also notes some numbness in this area.  She has had an new onset of urinary incontinence and frequency without dysuria.  She states she had similar symptoms after COVID earlier this year.  These had resolved.  She is having low back pain that is awakening her at night.  She has been under increased stress due to other family members with health issues.  She  reports some vomiting earlier this week and that is when she had the urinary incontinence every time she would vomit she would have this happen.  She is taking Tylenol for pain.  She has been doing stretching exercises for her hip.  Notes that her lateral hip pain is worse whenever she is sitting for a long time and then gets up or when getting in and out of her vehicle.  Review of Systems  Constitutional:  Positive for chills and fever.  Gastrointestinal:  Positive for vomiting.  Genitourinary:  Positive for frequency and urgency. Negative for dysuria.  Musculoskeletal:  Positive for back pain.    Objective: Vital Signs: LMP 03/29/2017   Physical Exam Constitutional:      Appearance: She is not ill-appearing or diaphoretic.  Pulmonary:     Effort: Pulmonary effort is normal.  Neurological:     Mental Status: She is alert and oriented to person, place, and time.  Psychiatric:        Behavior: Behavior normal.    Ortho Exam Bilateral hips good range of motion both hips.  External rotation of the right hip causes pain laterally.  She has tenderness over the trochanteric region of the right hip.  Straight leg  raise positive on the right negative on the left.  Strength testing 5 out of 5 throughout the lower extremities against resistance.  Tenderness lower lumbar sacral vertebral column region.  Also right paraspinous region in the lower lumbar spine. Specialty Comments:  No specialty comments available.  Imaging: XR Lumbar Spine 2-3 Views  Result Date: 08/14/2020 AP and lateral views lumbar spine: No acute fractures.  Disc base overall well-maintained.  No spondylolisthesis.  No bony abnormalities otherwise.    PMFS History: Patient Active Problem List   Diagnosis Date Noted   Local reactions to insect stings 10/04/2019   Coughing/wheezing 10/04/2019   Adverse drug reaction 10/04/2019   Third degree laceration of perineum, type 3c 07/16/2019   Neck pain 05/12/2017   IBS  (irritable bowel syndrome) 04/14/2017   Overweight (BMI 25.0-29.9) 04/14/2017   Right shoulder pain 04/14/2017   Chronic rhinitis 03/29/2016   Diverticulosis 03/29/2016   History of pituitary tumor 03/29/2016   Hypothyroidism 03/29/2016   Vertigo 03/29/2016   Vestibular migraine 03/29/2016   Past Medical History:  Diagnosis Date   Angio-edema    Anxiety    Brain tumor (benign) (HCC)    Chronic right shoulder pain    Depression    Diverticulitis    Diverticulosis    Eczema    GERD (gastroesophageal reflux disease)    Hypertension    IBS (irritable bowel syndrome)    Ileus, postoperative ([place]) 02/2019   Migraines    Mild intermittent asthma 10/04/2019   Partial bowel obstruction ([facility]) 02/2019   PONV (postoperative nausea and vomiting)    "Scopolamine patch helps a lot" per patient   Pre-diabetes    Seizures ([place]) 1990   07/12/2019 - "due to brain tumor, which has since been removed and no seizures since 1999"   Thyroid disease    Urticaria    Vertigo     Family History  Problem Relation Age of Onset   Asthma Maternal Aunt    Asthma Maternal Uncle    Allergic rhinitis Neg Hx    Eczema Neg Hx    Urticaria Neg Hx     Past Surgical History:  Procedure Laterality Date   ABDOMINAL HYSTERECTOMY     ANTERIOR CERVICAL DECOMP/DISCECTOMY FUSION Left 07/16/2019   Procedure: ANTERIOR CERVICAL DECOMPRESSION FUSION CERVICAL SEVEN-THORACIC ONE. LEFT-SIDED APPROACH;  Surgeon: Kary Kos, MD;  Location: [facility];  Service: Neurosurgery;  Laterality: Left;  left-side approach   BELPHAROPTOSIS REPAIR Left    BRAIN SURGERY     CERVICAL SPINE SURGERY  2020   COLONOSCOPY WITH ESOPHAGOGASTRODUODENOSCOPY (EGD)     COSMETIC SURGERY     Facial surgery due to dog bite   ENDOMETRIAL ABLATION     LAPAROSCOPIC LYSIS OF ADHESIONS  03/09/2019   with release of small bowel obstruction   NASAL SINUS SURGERY     OVARIAN CYST REMOVAL Right    TYMPANOSTOMY TUBE PLACEMENT     Social History    Occupational History   Not on file  Tobacco Use   Smoking status: Never   Smokeless tobacco: Never  Vaping Use   Vaping Use: Never used  Substance and Sexual Activity   Alcohol use: No   Drug use: No   Sexual activity: Yes    Birth control/protection: Surgical

## 2020-08-15 ENCOUNTER — Other Ambulatory Visit: Payer: Self-pay

## 2020-08-15 DIAGNOSIS — M5440 Lumbago with sciatica, unspecified side: Secondary | ICD-10-CM

## 2020-08-21 DIAGNOSIS — N3941 Urge incontinence: Secondary | ICD-10-CM | POA: Insufficient documentation

## 2020-08-27 ENCOUNTER — Other Ambulatory Visit: Payer: Medicaid Other

## 2020-08-27 ENCOUNTER — Ambulatory Visit
Admission: RE | Admit: 2020-08-27 | Discharge: 2020-08-27 | Disposition: A | Discharge status: Home / Self Care | Payer: Medicaid Other | Source: Ambulatory Visit | Attending: Physician Assistant | Admitting: Physician Assistant

## 2020-08-27 DIAGNOSIS — M5440 Lumbago with sciatica, unspecified side: Secondary | ICD-10-CM

## 2020-08-27 IMAGING — MR MR LUMBAR SPINE W/O CM
4 of 5 series · 27 of 48 positions shown · non-contrast
Comparison: X-ray [DATE], CT [DATE]

CLINICAL DATA: Low back pain with bilateral radiculopathy

EXAM:
MRI LUMBAR SPINE WITHOUT CONTRAST
TECHNIQUE: Multiplanar, multisequence MR imaging of the lumbar spine was
performed. No intravenous contrast was administered.

[Series 3: T2 · sagittal · 4.0mm · 1.09mm/px · 6 of 14 slices shown (1 of 2)]
[im 1/14]
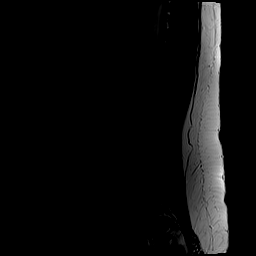
[im 3/14]
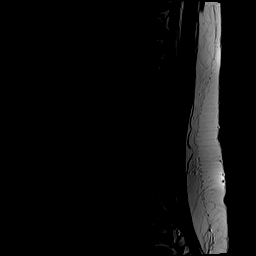
[im 6/14]
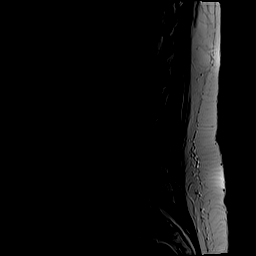
[im 8/14]
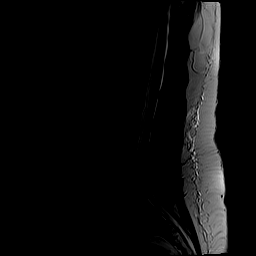
[im 11/14]
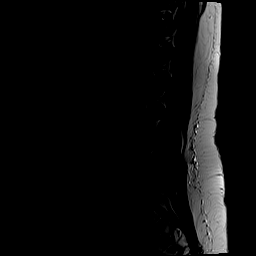
[im 14/14]
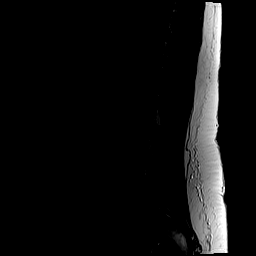

[Series 5: T1 · sagittal · 4.0mm · 1.09mm/px · 5 of 14 slices shown (1 of 2)]
[im 1/14]
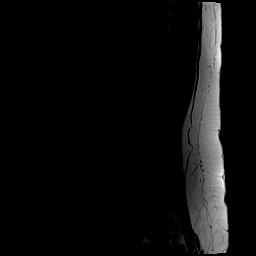
[im 4/14]
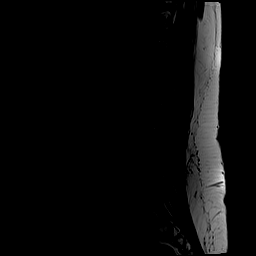
[im 7/14]
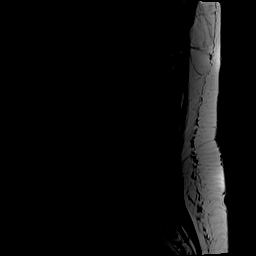
[im 10/14]
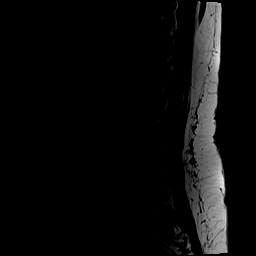
[im 14/14]
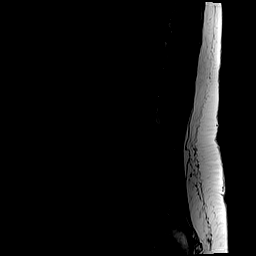

[Series 6: T2 · axial · 4.0mm · 0.39mm/px · z∈[-121,+97]mm · 10 of 42 slices shown (2 of 2)]
[im 3/42]
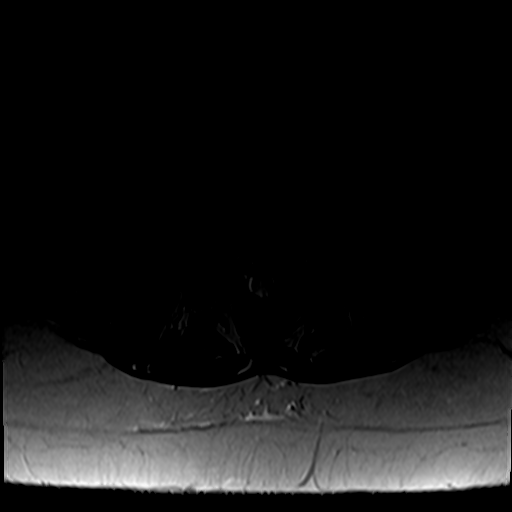
[im 6/42]
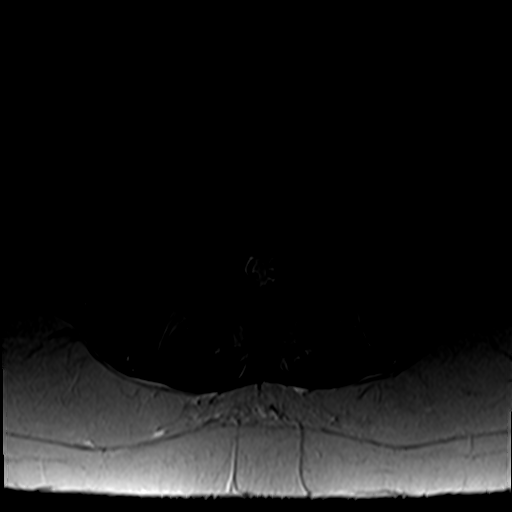
[im 9/42]
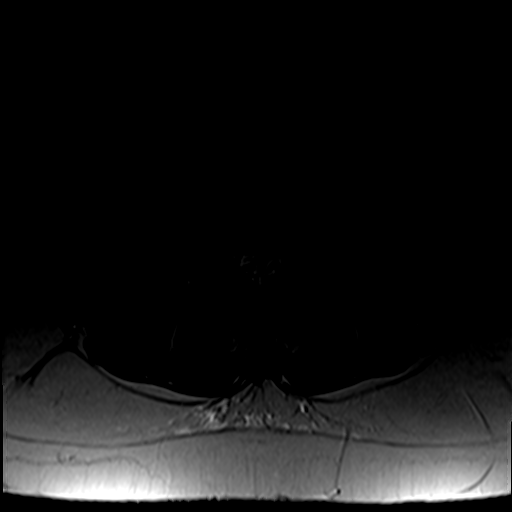
[im 14/42]
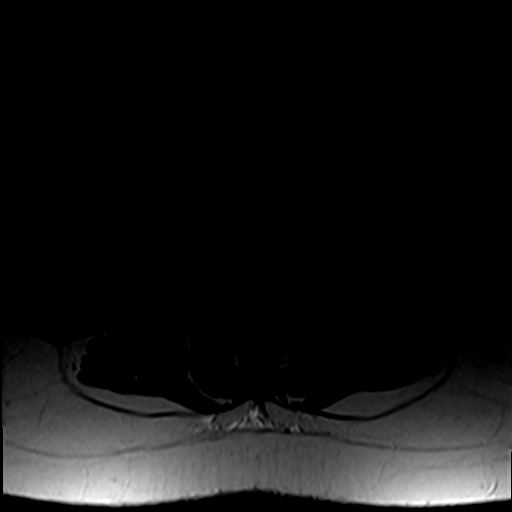
[im 20/42]
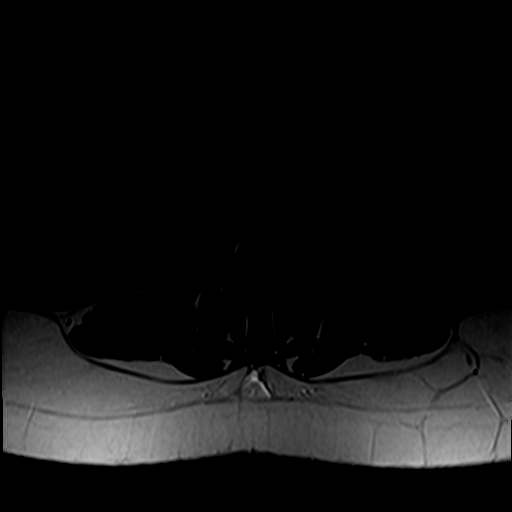
[im 22/42]
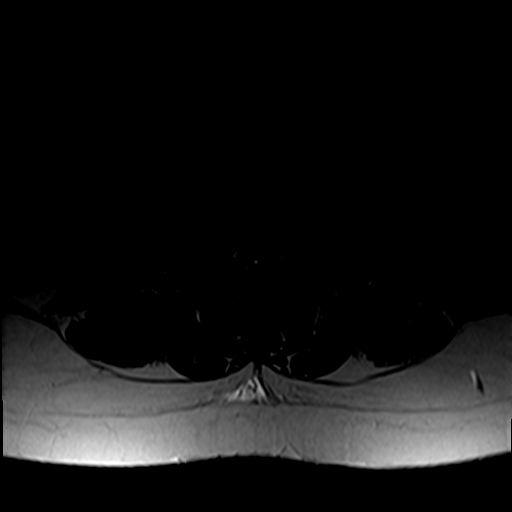
[im 25/42]
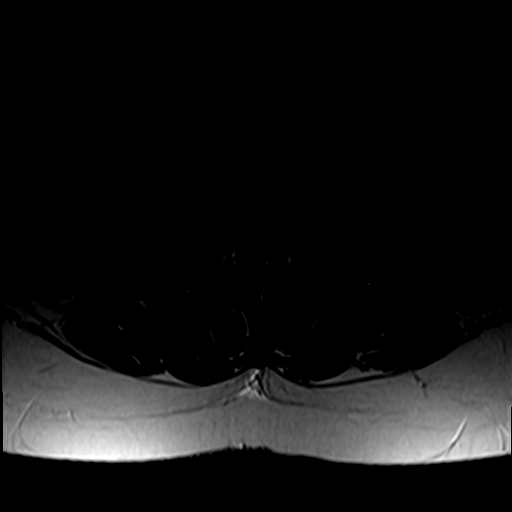
[im 31/42]
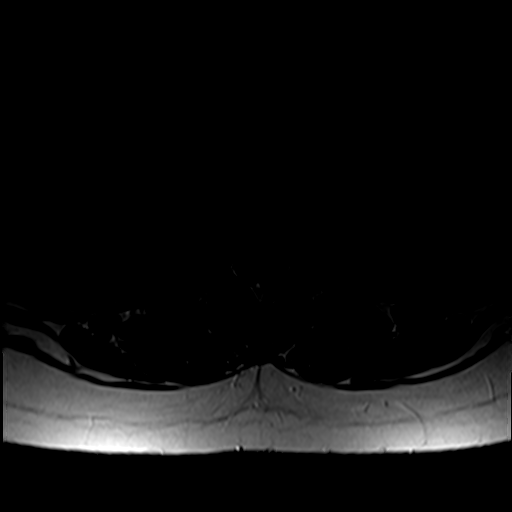
[im 36/42]
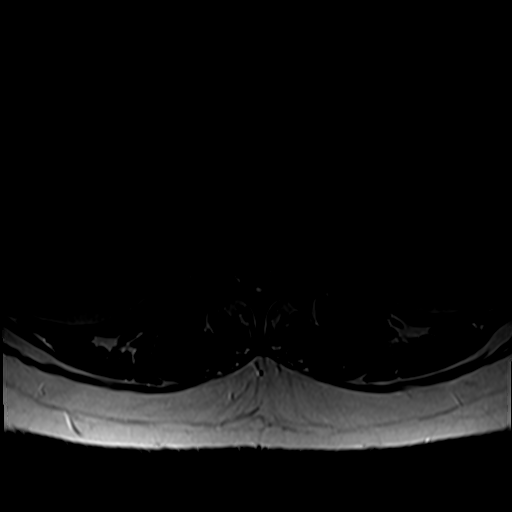
[im 42/42]
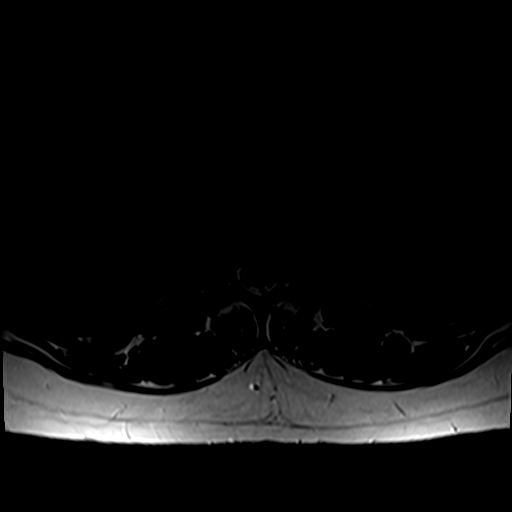

[Series 7: T1 · axial · 4.0mm · 0.39mm/px · z∈[-121,+69]mm · 6 of 42 slices shown (2 of 2)]
[im 3/42]
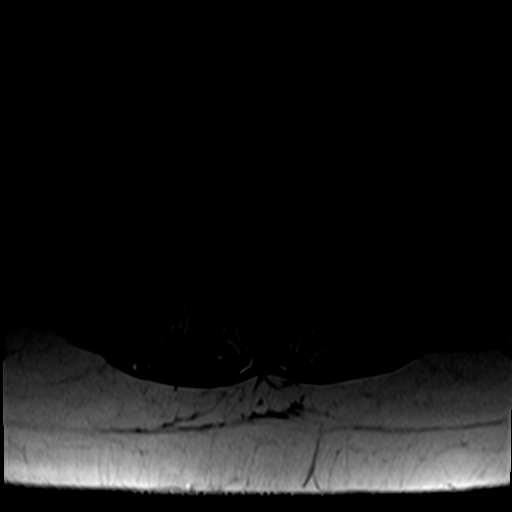
[im 6/42]
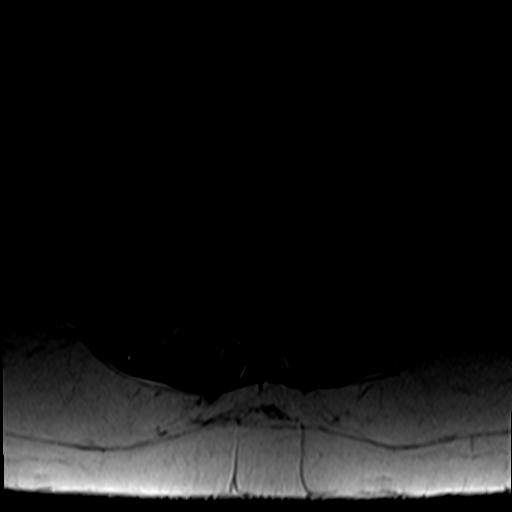
[im 9/42]
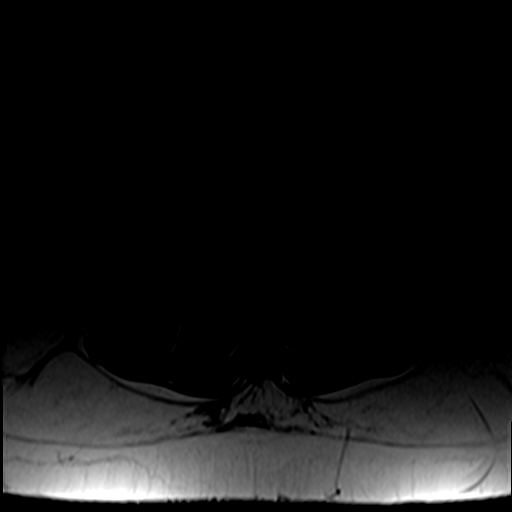
[im 14/42]
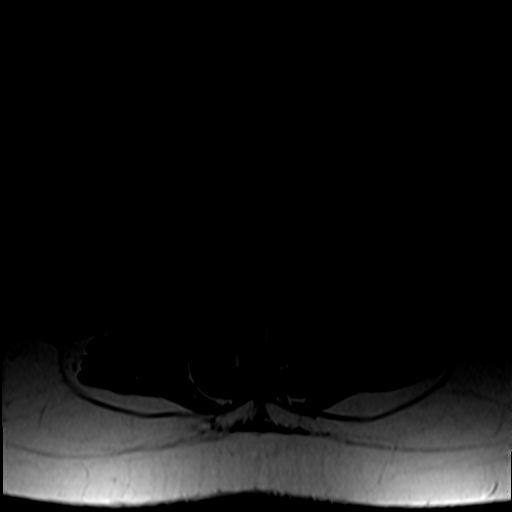
[im 22/42]
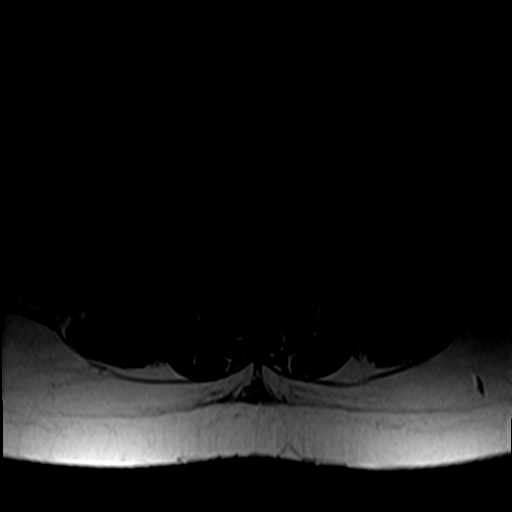
[im 36/42]
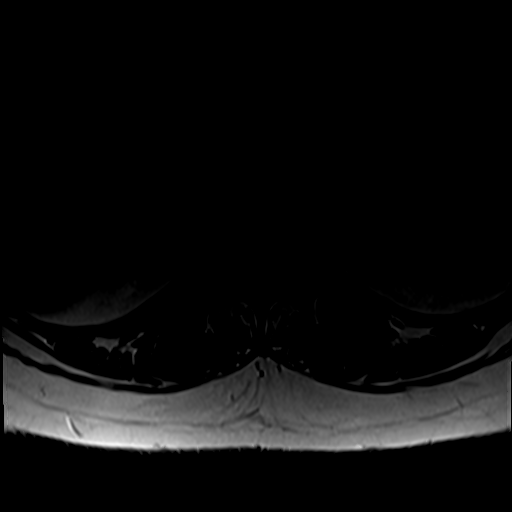

[27 of 48 positions shown; findings below may reference images not displayed]

FINDINGS: Segmentation:  Standard.

Alignment:  Physiologic.

Vertebrae:  No fracture, evidence of discitis, or bone lesion.

Conus medullaris and cauda equina: Conus extends to the L2 level.
Conus and cauda equina appear normal.

Paraspinal and other soft tissues: Retroaortic left renal vein, an
anatomic variant. Otherwise negative.

Disc levels:

T12-L1: Unremarkable.

L1-L2: Unremarkable.

L2-L3: Unremarkable.

L3-L4: Mild disc desiccation with shallow left foraminal/lateral
disc protrusion and central annular fissure. No foraminal or canal
stenosis.

L4-L5: Mild disc desiccation with minimal annular disc bulge and
left far lateral annular fissure. No foraminal or canal stenosis.

L5-S1: Mild annular disc bulge.  No foraminal or canal stenosis.
IMPRESSION: 1. Mild degenerative disc disease of the lower lumbar spine. No
foraminal or canal stenosis at any level.
2. Annular fissures of the L3-4 and L4-5 discs, which may be a
source of back pain.

## 2020-09-01 ENCOUNTER — Ambulatory Visit (INDEPENDENT_AMBULATORY_CARE_PROVIDER_SITE_OTHER): Payer: Medicaid Other | Admitting: Physician Assistant

## 2020-09-01 ENCOUNTER — Encounter: Payer: Self-pay | Admitting: Physician Assistant

## 2020-09-01 DIAGNOSIS — M5441 Lumbago with sciatica, right side: Secondary | ICD-10-CM

## 2020-09-01 NOTE — Progress Notes (Signed)
HPI: Kristine Zimmerman returns today to go over the MRI of her lumbar spine.  She is having pain in the right-sided low back.  She did see her PCP and was diagnosed with overactive bladder disease and was placed on medication. MRI of the lumbar spine was reviewed with the patient and images shown with the patient.  MRI shows no foraminal or canal stenosis throughout.  Annular fissures at L3-4 and L4-5.  Otherwise mild disc disease L3-L5.  Impression: Low back pain  Plan: At this point time recommend that she continue physical therapy for her lumbar spine and consider modalities such as manipulation.  Follow-up with [place] as needed or if pain persist or becomes worse.  Copy of her MRI was given so she can share this with her physical therapist.

## 2020-10-06 ENCOUNTER — Ambulatory Visit: Payer: Medicaid Other | Admitting: Physician Assistant

## 2020-10-13 ENCOUNTER — Encounter: Payer: Self-pay | Admitting: Physician Assistant

## 2020-10-13 ENCOUNTER — Other Ambulatory Visit: Payer: Self-pay

## 2020-10-13 ENCOUNTER — Ambulatory Visit (INDEPENDENT_AMBULATORY_CARE_PROVIDER_SITE_OTHER): Payer: Medicaid Other | Admitting: Physician Assistant

## 2020-10-13 VITALS — Ht 64.0 in | Wt 180.0 lb

## 2020-10-13 DIAGNOSIS — G8929 Other chronic pain: Secondary | ICD-10-CM

## 2020-10-13 DIAGNOSIS — M25562 Pain in left knee: Secondary | ICD-10-CM

## 2020-10-13 DIAGNOSIS — M25561 Pain in right knee: Secondary | ICD-10-CM | POA: Diagnosis not present

## 2020-10-13 MED ORDER — LIDOCAINE HCL 1 % IJ SOLN
3.0000 mL | INTRAMUSCULAR | Status: AC | PRN
  Administered 2020-10-13: 3 mL

## 2020-10-13 MED ORDER — METHYLPREDNISOLONE ACETATE 40 MG/ML IJ SUSP
40.0000 mg | INTRAMUSCULAR | Status: AC | PRN
  Administered 2020-10-13: 40 mg via INTRA_ARTICULAR

## 2020-10-13 NOTE — Progress Notes (Signed)
   Procedure Note  Patient: Kristine Zimmerman             Date of Birth: Dec 23, 1970           MRN: [id number]             Visit Date: 10/13/2020 HPI: [name] returns today for bilateral knee pain.  She has had radiographs of the both knees which are unremarkable.  She also had an MRI of her right knee that showed no cartilage irregularities.  She feels that last cortisone injections in April gave her good relief until recently.  She has left greater than right knee pain.  She is working with physical therapy and states that her knees feel good whenever she is doing therapy. She has had no new injuries.  Patient is nondiabetic  Review of systems negative for fevers chills.   Physical exam: Bilateral knees good range of motion bilateral knees.  Some patellofemoral crepitus bilaterally with passive range of motion.  No gross instability valgus varus stressing.  No abnormal warmth erythema of either knee.  Procedures: Visit Diagnoses:  1. Chronic pain of right knee   2. Chronic pain of left knee     Large Joint Inj: bilateral knee on 10/13/2020 2:06 PM Indications: pain Details: 22 G 1.5 in needle, anterolateral approach  Arthrogram: No  Medications (Right): 3 mL lidocaine 1 %; 40 mg methylPREDNISolone acetate 40 MG/ML Medications (Left): 3 mL lidocaine 1 %; 40 mg methylPREDNISolone acetate 40 MG/ML Outcome: tolerated well, no immediate complications Procedure, treatment alternatives, risks and benefits explained, specific risks discussed. Consent was given by the patient. Immediately prior to procedure a time out was called to verify the correct patient, procedure, equipment, support staff and site/side marked as required. Patient was prepped and draped in the usual sterile fashion.    Plan: Continue work on Forensic scientist.  Follow-up with [place] as needed.  Understands to wait at least 3 months between cortisone injections.

## 2021-01-14 ENCOUNTER — Encounter: Payer: Self-pay | Admitting: Physician Assistant

## 2021-01-14 ENCOUNTER — Ambulatory Visit (INDEPENDENT_AMBULATORY_CARE_PROVIDER_SITE_OTHER): Payer: Medicaid Other | Admitting: Physician Assistant

## 2021-01-14 DIAGNOSIS — M25561 Pain in right knee: Secondary | ICD-10-CM | POA: Diagnosis not present

## 2021-01-14 DIAGNOSIS — M2550 Pain in unspecified joint: Secondary | ICD-10-CM

## 2021-01-14 DIAGNOSIS — M25562 Pain in left knee: Secondary | ICD-10-CM | POA: Diagnosis not present

## 2021-01-14 NOTE — Progress Notes (Signed)
HPI: [name] comes in today with multiple complaints.  She states she is having significant pains in both legs.  She is due to see a vein specialist next month.  She states her pain overall is getting worse and now she is having back neck ankle knees hand elbow and even humeral pain.  She states she has swelling in her hands.  She states she is felt multiple body aches since recovering from [place].  She has had no fevers chills.  She notes that the last cortisone injections in both knees in August gave her no real relief.  She feels like her legs are weak.  She did finish up with physical therapy for strengthening of the lower extremities and feels like this helps some.  Review of systems: See HPI otherwise negative.  Physical exam: General well-developed well-nourished female no acute distress.  Ambulates without any assistive device. Lower extremities: Bilateral knees she has good range of motion of both knees with crepitus with passive range of motion left knee.  No instability valgus varus stressing of either knee no abnormal warmth erythema or effusion of either knee.  No rashes either knee.  Calves are supple nontender bilaterally.  She has full dorsiflexion plantarflexion bilateral ankles.  Tenderness just inferior to the lateral malleolus left ankle.  No swelling in this region no ecchymosis.  She has 5 out of 5 strength with inversion eversion against resistance bilateral feet without pain.  Bilateral hips excellent range of motion without pain.  Impression: Multiple arthralgias.  Plan: Given her multiple arthralgias and basically normal MRI of the right knee along with normal radiographs of the left knee recommend referral to rheumatology.  She denies any family history of rheumatoid arthritis.  Questions were encouraged and answered at length today.  Follow-up with [place] as needed.

## 2021-01-19 NOTE — Addendum Note (Signed)
Addended by: Robyne Peers on: 01/19/2021 08:54 AM   Modules accepted: Orders

## 2021-02-12 ENCOUNTER — Ambulatory Visit: Payer: Medicaid Other | Admitting: Physician Assistant

## 2021-03-02 NOTE — Progress Notes (Deleted)
Office Visit Note  Patient: Kristine Zimmerman             Date of Birth: February 09, 1971           MRN: [id number]             PCP: Kateri Mc, MD Referring: Pete Pelt, PA-C Visit Date: 03/16/2021 Occupation: @GUAROCC @  Subjective:  No chief complaint on file.   History of Present Illness: Kristine Zimmerman is a 51 y.o. female ***   Activities of Daily Living:  Patient reports morning stiffness for *** {minute/hour:19697}.   Patient {ACTIONS;DENIES/REPORTS:21021675::"Denies"} nocturnal pain.  Difficulty dressing/grooming: {ACTIONS;DENIES/REPORTS:21021675::"Denies"} Difficulty climbing stairs: {ACTIONS;DENIES/REPORTS:21021675::"Denies"} Difficulty getting out of chair: {ACTIONS;DENIES/REPORTS:21021675::"Denies"} Difficulty using hands for taps, buttons, cutlery, and/or writing: {ACTIONS;DENIES/REPORTS:21021675::"Denies"}  No Rheumatology ROS completed.   PMFS History:  Patient Active Problem List   Diagnosis Date Noted   Local reactions to insect stings 10/04/2019   Coughing/wheezing 10/04/2019   Adverse drug reaction 10/04/2019   Third degree laceration of perineum, type 3c 07/16/2019   Neck pain 05/12/2017   IBS (irritable bowel syndrome) 04/14/2017   Overweight (BMI 25.0-29.9) 04/14/2017   Right shoulder pain 04/14/2017   Chronic rhinitis 03/29/2016   Diverticulosis 03/29/2016   History of pituitary tumor 03/29/2016   Hypothyroidism 03/29/2016   Vertigo 03/29/2016   Vestibular migraine 03/29/2016    Past Medical History:  Diagnosis Date   Angio-edema    Anxiety    Brain tumor (benign) (HCC)    Chronic right shoulder pain    Depression    Diverticulitis    Diverticulosis    Eczema    GERD (gastroesophageal reflux disease)    Hypertension    IBS (irritable bowel syndrome)    Ileus, postoperative ([facility]) 02/2019   Migraines    Mild intermittent asthma 10/04/2019   Partial bowel obstruction ([facility]) 02/2019   PONV (postoperative nausea and vomiting)     "Scopolamine patch helps a lot" per patient   Pre-diabetes    Seizures ([facility]) 1990   07/12/2019 - "due to brain tumor, which has since been removed and no seizures since 1999"   Thyroid disease    Urticaria    Vertigo     Family History  Problem Relation Age of Onset   Asthma Maternal Aunt    Asthma Maternal Uncle    Allergic rhinitis Neg Hx    Eczema Neg Hx    Urticaria Neg Hx    Past Surgical History:  Procedure Laterality Date   ABDOMINAL HYSTERECTOMY     ANTERIOR CERVICAL DECOMP/DISCECTOMY FUSION Left 07/16/2019   Procedure: ANTERIOR CERVICAL DECOMPRESSION FUSION CERVICAL SEVEN-THORACIC ONE. LEFT-SIDED APPROACH;  Surgeon: Kary Kos, MD;  Location: [facility];  Service: Neurosurgery;  Laterality: Left;  left-side approach   BELPHAROPTOSIS REPAIR Left    BRAIN SURGERY     CERVICAL SPINE SURGERY  2020   COLONOSCOPY WITH ESOPHAGOGASTRODUODENOSCOPY (EGD)     COSMETIC SURGERY     Facial surgery due to dog bite   ENDOMETRIAL ABLATION     LAPAROSCOPIC LYSIS OF ADHESIONS  03/09/2019   with release of small bowel obstruction   NASAL SINUS SURGERY     OVARIAN CYST REMOVAL Right    TYMPANOSTOMY TUBE PLACEMENT     Social History   Social History Narrative   Not on file    There is no immunization history on file for this patient.   Objective: Vital Signs: LMP 03/29/2017    Physical Exam   Musculoskeletal Exam: ***  CDAI Exam: CDAI Score: -- Patient Global: --; Provider Global: -- Swollen: --; Tender: -- Joint Exam 03/16/2021   No joint exam has been documented for this visit   There is currently no information documented on the homunculus. Go to the Rheumatology activity and complete the homunculus joint exam.  Investigation: No additional findings.  Imaging: No results found.  Recent Labs: Lab Results  Component Value Date   WBC 8.1 07/12/2019   HGB 13.3 07/12/2019   PLT 287 07/12/2019   NA 139 07/12/2019   K 3.1 (L) 07/12/2019   CL 102 07/12/2019   CO2 28  07/12/2019   GLUCOSE 108 (H) 07/12/2019   BUN 12 07/12/2019   CREATININE 0.76 07/12/2019   BILITOT 0.4 11/09/2017   ALKPHOS 45 11/09/2017   AST 22 11/09/2017   ALT 14 11/09/2017   PROT 7.4 11/09/2017   ALBUMIN 4.1 11/09/2017   CALCIUM 9.4 07/12/2019   GFRAA >60 07/12/2019    Speciality Comments: No specialty comments available.  Procedures:  No procedures performed Allergies: Estradiol and Morphine   Assessment / Plan:     Visit Diagnoses: Polyarthralgia  Vestibular migraine  Diverticulosis  History of IBS  History of hypothyroidism  History of pituitary tumor  Vertigo  Orders: No orders of the defined types were placed in this encounter.  No orders of the defined types were placed in this encounter.   Face-to-face time spent with patient was *** minutes. Greater than 50% of time was spent in counseling and coordination of care.  Follow-Up Instructions: No follow-ups on file.   Ofilia Neas, PA-C  Note - This record has been created using Dragon software.  Chart creation errors have been sought, but may not always  have been located. Such creation errors do not reflect on  the standard of medical care.

## 2021-03-16 ENCOUNTER — Ambulatory Visit: Payer: Medicaid Other | Admitting: Rheumatology

## 2021-03-16 ENCOUNTER — Ambulatory Visit (INDEPENDENT_AMBULATORY_CARE_PROVIDER_SITE_OTHER): Payer: Medicaid Other | Admitting: Orthopaedic Surgery

## 2021-03-16 ENCOUNTER — Other Ambulatory Visit: Payer: Self-pay

## 2021-03-16 DIAGNOSIS — M25562 Pain in left knee: Secondary | ICD-10-CM

## 2021-03-16 DIAGNOSIS — G8929 Other chronic pain: Secondary | ICD-10-CM

## 2021-03-16 DIAGNOSIS — Z8639 Personal history of other endocrine, nutritional and metabolic disease: Secondary | ICD-10-CM

## 2021-03-16 DIAGNOSIS — M2550 Pain in unspecified joint: Secondary | ICD-10-CM

## 2021-03-16 DIAGNOSIS — G43809 Other migraine, not intractable, without status migrainosus: Secondary | ICD-10-CM

## 2021-03-16 DIAGNOSIS — Z87898 Personal history of other specified conditions: Secondary | ICD-10-CM

## 2021-03-16 DIAGNOSIS — R42 Dizziness and giddiness: Secondary | ICD-10-CM

## 2021-03-16 DIAGNOSIS — K5790 Diverticulosis of intestine, part unspecified, without perforation or abscess without bleeding: Secondary | ICD-10-CM

## 2021-03-16 DIAGNOSIS — Z8719 Personal history of other diseases of the digestive system: Secondary | ICD-10-CM

## 2021-03-16 MED ORDER — METHYLPREDNISOLONE ACETATE 40 MG/ML IJ SUSP
40.0000 mg | INTRAMUSCULAR | Status: AC | PRN
  Administered 2021-03-16: 40 mg via INTRA_ARTICULAR

## 2021-03-16 MED ORDER — LIDOCAINE HCL 1 % IJ SOLN
3.0000 mL | INTRAMUSCULAR | Status: AC | PRN
  Administered 2021-03-16: 3 mL

## 2021-03-16 NOTE — Progress Notes (Signed)
Office Visit Note   Patient: Kristine Zimmerman           Date of Birth: Dec 16, 1970           MRN: [id number] Visit Date: 03/16/2021              Requested by: Kateri Mc, MD [address] [place],  [place] [address] PCP: Kateri Mc, MD   Assessment & Plan: Visit Diagnoses:  1. Chronic pain of left knee     Plan: Per the patient's request I did provide a steroid injection in her left knee.  At this point from our standpoint follow-up is as needed.  I will defer to the rheumatologist at this standpoint given her multiple body aches and complaints.  Follow-Up Instructions: Return if symptoms worsen or fail to improve.   Orders:  Orders Placed This Encounter  Procedures   Large Joint Inj   No orders of the defined types were placed in this encounter.     Procedures: Large Joint Inj: L knee on 03/16/2021 10:04 AM Indications: diagnostic evaluation and pain Details: 22 G 1.5 in needle, superolateral approach  Arthrogram: No  Medications: 3 mL lidocaine 1 %; 40 mg methylPREDNISolone acetate 40 MG/ML Outcome: tolerated well, no immediate complications Procedure, treatment alternatives, risks and benefits explained, specific risks discussed. Consent was given by the patient. Immediately prior to procedure a time out was called to verify the correct patient, procedure, equipment, support staff and site/side marked as required. Patient was prepped and draped in the usual sterile fashion.      Clinical Data: No additional findings.   Subjective: Chief Complaint  Patient presents with   Left Knee - Pain  The patient comes in today requesting a steroid injection of the left knee.  She hurts in the back of her knee.  We have worked up her right knee before which was negative.  She does have multiple joint and body and muscle complaints and is actually seeing a rheumatologist soon.  She is scheduled to have some type of vein surgery on her right leg in March.  She  says standing hurts and just doing activities day living hurts over much of her body.  She denies any specific injuries.  She has had no other acute change in her medical status.  She is not a diabetic.  HPI  Review of Systems Today there is no listed fever, chills, nausea, vomiting  Objective: Vital Signs: LMP 03/29/2017   Physical Exam She is alert and orient x3 and in no acute distress Ortho Exam Examination of her left knee shows no effusion.  Her range of motion is full.  She does have some pain in the knee itself but no instability on ligamentous exam.  Specialty Comments:  No specialty comments available.  Imaging: No results found.   PMFS History: Patient Active Problem List   Diagnosis Date Noted   Local reactions to insect stings 10/04/2019   Coughing/wheezing 10/04/2019   Adverse drug reaction 10/04/2019   Third degree laceration of perineum, type 3c 07/16/2019   Neck pain 05/12/2017   IBS (irritable bowel syndrome) 04/14/2017   Overweight (BMI 25.0-29.9) 04/14/2017   Right shoulder pain 04/14/2017   Chronic rhinitis 03/29/2016   Diverticulosis 03/29/2016   History of pituitary tumor 03/29/2016   Hypothyroidism 03/29/2016   Vertigo 03/29/2016   Vestibular migraine 03/29/2016   Past Medical History:  Diagnosis Date   Angio-edema    Anxiety  Brain tumor (benign) (HCC)    Chronic right shoulder pain    Depression    Diverticulitis    Diverticulosis    Eczema    GERD (gastroesophageal reflux disease)    Hypertension    IBS (irritable bowel syndrome)    Ileus, postoperative ([place] [facility]) 02/2019   Migraines    Mild intermittent asthma 10/04/2019   Partial bowel obstruction ([facility]) 02/2019   PONV (postoperative nausea and vomiting)    "Scopolamine patch helps a lot" per patient   Pre-diabetes    Seizures ([place]) 1990   07/12/2019 - "due to brain tumor, which has since been removed and no seizures since 1999"   Thyroid disease    Urticaria    Vertigo      Family History  Problem Relation Age of Onset   Asthma Maternal Aunt    Asthma Maternal Uncle    Allergic rhinitis Neg Hx    Eczema Neg Hx    Urticaria Neg Hx     Past Surgical History:  Procedure Laterality Date   ABDOMINAL HYSTERECTOMY     ANTERIOR CERVICAL DECOMP/DISCECTOMY FUSION Left 07/16/2019   Procedure: ANTERIOR CERVICAL DECOMPRESSION FUSION CERVICAL SEVEN-THORACIC ONE. LEFT-SIDED APPROACH;  Surgeon: Kary Kos, MD;  Location: [facility];  Service: Neurosurgery;  Laterality: Left;  left-side approach   BELPHAROPTOSIS REPAIR Left    BRAIN SURGERY     CERVICAL SPINE SURGERY  2020   COLONOSCOPY WITH ESOPHAGOGASTRODUODENOSCOPY (EGD)     COSMETIC SURGERY     Facial surgery due to dog bite   ENDOMETRIAL ABLATION     LAPAROSCOPIC LYSIS OF ADHESIONS  03/09/2019   with release of small bowel obstruction   NASAL SINUS SURGERY     OVARIAN CYST REMOVAL Right    TYMPANOSTOMY TUBE PLACEMENT     Social History   Occupational History   Not on file  Tobacco Use   Smoking status: Never   Smokeless tobacco: Never  Vaping Use   Vaping Use: Never used  Substance and Sexual Activity   Alcohol use: No   Drug use: No   Sexual activity: Yes    Birth control/protection: Surgical

## 2021-04-06 ENCOUNTER — Ambulatory Visit: Payer: Medicaid Other | Admitting: Rheumatology

## 2021-04-28 ENCOUNTER — Ambulatory Visit: Payer: Medicaid Other | Admitting: Internal Medicine

## 2021-04-29 NOTE — Progress Notes (Signed)
Office Visit Note  Patient: Kristine Zimmerman             Date of Birth: 01/20/1971           MRN: [id number]             PCP: Kateri Mc, MD Referring: Kateri Mc, MD Visit Date: 04/30/2021   Subjective:  New Patient (Initial Visit) (Chronic total body pain)   History of Present Illness: Kristine Zimmerman is a 51 y.o. female here for evaluation of joint pain in multiple areas. She has a history of vestibular migraines, IBS, hypothyroidism, and pituitary tumor. She has pain in several areas including bilateral hand pain and swelling, back pain, and pain in legs also with significant venous insufficiency. Workup has included unremarkable xrays and also MRI of the right knee without significant abnormality. Problems are ongoing for the past year some progression or worsening over time. She was ill with COVID in December 2021-January 2022 and these generalized symptoms with pain and swelling are more prominent since that time. She feels very cold in peripheral areas and has dependent edema in both feet. Sometimes purple discoloration affecting the feet, no pallor. Fingers swelling without discoloration. She also has degree of back pain and tenderness to light touch or pressure at times. She takes cymbalta 30 mg and gabapentin 100 mg at night which are partially helpful.   08/27/20 MR Lumbar spine IMPRESSION: 1. 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 No foraminal or canal stenosis at any level. 2. Annular fissures of the L3-4 and L4-5 discs, which may be a source of back pain.  12/14/19 MR Right knee IMPRESSION: Negative for meniscal or ligament tear. Mild intrasubstance degenerative signal change posterior horn medial meniscus.   Discoid lateral meniscus.  Activities of Daily Living:  Patient reports morning stiffness for 24 hours.   Patient Reports nocturnal pain.  Difficulty dressing/grooming: Reports Difficulty climbing stairs: Reports Difficulty getting out  of chair: Reports Difficulty using hands for taps, buttons, cutlery, and/or writing: Reports  Review of Systems  Constitutional:  Positive for fatigue.  HENT:  Positive for mouth dryness.   Eyes:  Positive for dryness.  Respiratory:  Negative for shortness of breath.   Cardiovascular:  Positive for swelling in legs/feet.  Gastrointestinal:  Positive for constipation and diarrhea.  Endocrine: Positive for cold intolerance, heat intolerance, excessive thirst and increased urination.  Genitourinary:  Negative for difficulty urinating.  Musculoskeletal:  Positive for joint pain, gait problem, joint pain, joint swelling, muscle weakness, morning stiffness and muscle tenderness.  Skin:  Negative for rash.  Allergic/Immunologic: Positive for susceptible to infections.  Neurological:  Positive for numbness and weakness.  Hematological:  Positive for bruising/bleeding tendency.  Psychiatric/Behavioral:  Positive for sleep disturbance.     PMFS History:  Patient Active Problem List   Diagnosis Date Noted   Chronic heel pain, left 06/24/2021   Left hemiparesis ([place]) 04/30/2021   Bilateral foot pain 04/30/2021   Bilateral hand swelling 04/30/2021   Sedimentation rate elevation 04/30/2021   Bilateral dry eyes 04/30/2021   Generalized pain 04/30/2021   Urge urinary incontinence 08/21/2020   Mitral valve sclerosis 07/01/2020   Non-smoker 06/29/2020   Sinus tachycardia 06/29/2020   Shortness of breath 06/29/2020   Chronic recurrent major depressive disorder ([place]) 05/27/2020   History of 2019 novel coronavirus disease (COVID-19) 03/30/2020   Anemia of chronic disease 03/30/2020   Deep vein thrombosis (DVT) of proximal vein of left lower extremity (  [place]) 03/13/2020   GAD (generalized anxiety disorder) 03/13/2020   Obstructive sleep apnea (adult) (pediatric) 02/07/2020   History of small bowel obstruction 12/05/2019   Benign essential HTN 10/12/2019   Local reactions to insect stings  10/04/2019   Coughing/wheezing 10/04/2019   Adverse drug reaction 10/04/2019   Third degree laceration of perineum, type 3c 07/16/2019   History of seizures 06/12/2019   Abdominal bloating 04/18/2019   History of fusion of cervical spine 03/29/2019   Menopausal vasomotor syndrome 03/16/2019   History of total hysterectomy 03/02/2019   Radiculopathy, cervical region 06/29/2018   Neck pain 05/12/2017   IBS (irritable bowel syndrome) 04/14/2017   Overweight (BMI 25.0-29.9) 04/14/2017   Right shoulder pain 04/14/2017   Chronic rhinitis 03/29/2016   Diverticulosis 03/29/2016   History of pituitary tumor 03/29/2016   Hypothyroidism 03/29/2016   Vertigo 03/29/2016   Vestibular migraine 03/29/2016   Chronic headaches 03/29/2016    Past Medical History:  Diagnosis Date   Angio-edema    Anxiety    Brain tumor (benign) (HCC)    Chronic right shoulder pain    Depression    Diverticulitis    Diverticulosis    Eczema    GERD (gastroesophageal reflux disease)    Hypertension    IBS (irritable bowel syndrome)    Ileus, postoperative ([place]) 02/2019   Migraines    Mild intermittent asthma 10/04/2019   Partial bowel obstruction ([place]) 02/2019   PONV (postoperative nausea and vomiting)    "Scopolamine patch helps a lot" per patient   Pre-diabetes    Seizures ([place]) 1990   07/12/2019 - "due to brain tumor, which has since been removed and no seizures since 1999"   Thyroid disease    Urticaria    Vertigo     Family History  Problem Relation Age of Onset   Sudden death Father        MVA   Gout Brother    Sudden death Brother        COVID   Asthma Maternal Aunt    Asthma Maternal Uncle    Allergic rhinitis Neg Hx    Eczema Neg Hx    Urticaria Neg Hx    Past Surgical History:  Procedure Laterality Date   ABDOMINAL HYSTERECTOMY     ABLATION Bilateral    ANTERIOR CERVICAL DECOMP/DISCECTOMY FUSION Left 07/16/2019   Procedure: ANTERIOR CERVICAL DECOMPRESSION FUSION CERVICAL  SEVEN-THORACIC ONE. LEFT-SIDED APPROACH;  Surgeon: Kary Kos, MD;  Location: [facility];  Service: Neurosurgery;  Laterality: Left;  left-side approach   BELPHAROPTOSIS REPAIR Left    BRAIN SURGERY     CERVICAL SPINE SURGERY  2020   COLONOSCOPY WITH ESOPHAGOGASTRODUODENOSCOPY (EGD)     COSMETIC SURGERY     Facial surgery due to dog bite   ENDOMETRIAL ABLATION     LAPAROSCOPIC LYSIS OF ADHESIONS  03/09/2019   with release of small bowel obstruction   NASAL SINUS SURGERY     OVARIAN CYST REMOVAL Right    TYMPANOSTOMY TUBE PLACEMENT     Social History   Social History Narrative   Not on file   Immunization History  Administered Date(s) Administered   Influenza,inj,quad, With Preservative 11/29/2017   Tdap 06/14/2019     Objective: Vital Signs: BP 119/76 (BP Location: Right Arm, Patient Position: Sitting, Cuff Size: Normal)   Pulse 68   Resp 15   Ht '5\' 4"'$  (1.626 m)   Wt 195 lb (88.5 kg)   LMP 03/29/2017   BMI 33.47 kg/m  Physical Exam Constitutional:      Appearance: She is obese.  HENT:     Mouth/Throat:     Mouth: Mucous membranes are dry.  Cardiovascular:     Rate and Rhythm: Normal rate and regular rhythm.  Pulmonary:     Effort: Pulmonary effort is normal.     Breath sounds: Normal breath sounds.  Musculoskeletal:     Right lower leg: Edema present.     Left lower leg: Edema present.     Comments: Tortuous superficial veins at ankles and heels bilaterally  Skin:    General: Skin is warm and dry.     Findings: No rash.  Neurological:     Mental Status: She is alert.  Psychiatric:     Comments: Flat affect, slow speech      Musculoskeletal Exam:  Shoulders full ROM no tenderness or swelling Elbows full ROM no tenderness or swelling Wrists full ROM no tenderness or swelling Fingers full ROM mild chronic appearing enlargement in right 2nd-3rd MCPs with tenderness to pressure Low back paraspinal muscle tenderness to pressure, left hip anterior pain with  FADIR movement Knees full ROM no tenderness or swelling Severe tenderness to pressure on bottom of calcaneus extending to bottom of foot arch   Investigation: No additional findings.  Imaging: No results found.  Recent Labs: Lab Results  Component Value Date   WBC 8.1 07/12/2019   HGB 13.3 07/12/2019   PLT 287 07/12/2019   NA 139 07/12/2019   K 3.1 (L) 07/12/2019   CL 102 07/12/2019   CO2 28 07/12/2019   GLUCOSE 108 (H) 07/12/2019   BUN 12 07/12/2019   CREATININE 0.76 07/12/2019   BILITOT 0.4 11/09/2017   ALKPHOS 45 11/09/2017   AST 22 11/09/2017   ALT 14 11/09/2017   PROT 7.4 11/09/2017   ALBUMIN 4.1 11/09/2017   CALCIUM 9.4 07/12/2019   GFRAA >60 07/12/2019    Speciality Comments: No specialty comments available.  Procedures:  No procedures performed Allergies: Estradiol and Morphine   Assessment / Plan:     Visit Diagnoses: Sedimentation rate elevation Bilateral hand swelling - Plan: ANA, Rheumatoid factor, Sedimentation rate  Currently normal, describes intermittent swelling. This may be more edema or synovitis hard to say from description. Checking ANA, RF, and sed rate for markers of inflammatory arthritis process. Previously elevated sed rate but also had more objective swelling described at that time. Could be a reactive or post-COVID inflammation issue.  Bilateral foot pain  Foot pain on exam looks most consistent with plantar fasciitis, no obvious inflammation or structural changes. Recommended at home exercises and provided printed instructions.  Generalized pain  Widespread pain possibly consistent with myofascial pain or chronic pain syndrome. She does have fatigue and irritable bowel symptoms as well. Currently on cymbalta and gabapentin.  Orders: Orders Placed This Encounter  Procedures   ANA   Rheumatoid factor   Sedimentation rate   No orders of the defined types were placed in this encounter.    Follow-Up Instructions: Return in about  6 weeks (around 06/11/2021) for New pt plantar fasciitis/?COVID related f/u 6-8wks.   Collier Salina, MD  Note - This record has been created using [employer].  Chart creation errors have been sought, but may not always  have been located. Such creation errors do not reflect on  the standard of medical care.

## 2021-04-30 ENCOUNTER — Encounter: Payer: Self-pay | Admitting: Internal Medicine

## 2021-04-30 ENCOUNTER — Ambulatory Visit (INDEPENDENT_AMBULATORY_CARE_PROVIDER_SITE_OTHER): Payer: Medicaid Other | Admitting: Internal Medicine

## 2021-04-30 ENCOUNTER — Other Ambulatory Visit: Payer: Self-pay

## 2021-04-30 VITALS — BP 119/76 | HR 68 | Resp 15 | Ht 64.0 in | Wt 195.0 lb

## 2021-04-30 DIAGNOSIS — R700 Elevated erythrocyte sedimentation rate: Secondary | ICD-10-CM

## 2021-04-30 DIAGNOSIS — M79672 Pain in left foot: Secondary | ICD-10-CM

## 2021-04-30 DIAGNOSIS — M7989 Other specified soft tissue disorders: Secondary | ICD-10-CM

## 2021-04-30 DIAGNOSIS — M79671 Pain in right foot: Secondary | ICD-10-CM

## 2021-04-30 DIAGNOSIS — H04123 Dry eye syndrome of bilateral lacrimal glands: Secondary | ICD-10-CM

## 2021-04-30 DIAGNOSIS — G8194 Hemiplegia, unspecified affecting left nondominant side: Secondary | ICD-10-CM | POA: Insufficient documentation

## 2021-04-30 DIAGNOSIS — R52 Pain, unspecified: Secondary | ICD-10-CM

## 2021-05-01 LAB — RHEUMATOID FACTOR: Rheumatoid fact SerPl-aCnc: <14 IU/mL (ref <14)

## 2021-05-01 LAB — SEDIMENTATION RATE: Sed Rate: 22 mm/h (ref 0–30)

## 2021-05-01 LAB — ANA: Anti Nuclear Antibody (ANA): NEGATIVE

## 2021-05-25 ENCOUNTER — Ambulatory Visit: Payer: Medicaid Other | Admitting: Rheumatology

## 2021-05-26 ENCOUNTER — Ambulatory Visit: Payer: Medicaid Other | Admitting: Internal Medicine

## 2021-06-15 ENCOUNTER — Ambulatory Visit: Payer: Medicaid Other | Admitting: Rheumatology

## 2021-06-23 NOTE — Progress Notes (Unsigned)
Office Visit Note  Patient: Kristine Zimmerman             Date of Birth: [birth year]           MRN: [id number]             PCP: Kateri Mc, MD Referring: Kateri Mc, MD Visit Date: 06/24/2021   Subjective:  Follow-up (Always cold, tense)   History of Present Illness: Kristine Zimmerman is a 51 y.o. female here for follow up with multiple joint pains probable plantar fasciitis on initial exam autoimmune labs were negative. She continues to be cold much of the time and has persistent soreness and stiffness. She as not seen as much swelling in her knuckles as before. She has persistent swelling in legs despite GSV ablation on both sides. Wears compression socks for this with some benefit. She notices purple discoloration on soles of her feet often. Left heel pain is the worst area limiting walking and mobility.  Previous HPI 04/30/21 Kristine Zimmerman is a 51 y.o. female here for evaluation of joint pain in multiple areas. She has a history of vestibular migraines, IBS, hypothyroidism, and pituitary tumor. She has pain in several areas including bilateral hand pain and swelling, back pain, and pain in legs also with significant venous insufficiency. Workup has included unremarkable xrays and also MRI of the right knee without significant abnormality. Problems are ongoing for the past year some progression or worsening over time. She was ill with COVID in December 2021-January 2022 and these generalized symptoms with pain and swelling are more prominent since that time. She feels very cold in peripheral areas and has dependent edema in both feet. Sometimes purple discoloration affecting the feet, no pallor. Fingers swelling without discoloration. She also has degree of back pain and tenderness to light touch or pressure at times. She takes cymbalta 30 mg and gabapentin 100 mg at night which are partially helpful.    08/27/20 MR Lumbar spine IMPRESSION: 1. Mild degenerative disc disease of the  lower lumbar spine. No foraminal or canal stenosis at any level. 2. Annular fissures of the L3-4 and L4-5 discs, which may be a source of back pain.   12/14/19 MR Right knee IMPRESSION: Negative for meniscal or ligament tear. Mild intrasubstance degenerative signal change posterior horn medial meniscus.   Discoid lateral meniscus.   Review of Systems  Constitutional:  Positive for fatigue.  HENT:  Positive for mouth dryness.   Eyes:  Positive for dryness.  Respiratory:  Negative for shortness of breath.   Cardiovascular:  Negative for swelling in legs/feet.  Gastrointestinal:  Positive for constipation.  Endocrine: Positive for cold intolerance, excessive thirst and increased urination.  Genitourinary:  Negative for difficulty urinating.  Musculoskeletal:  Positive for joint pain, gait problem, joint pain, joint swelling, muscle weakness and morning stiffness.  Skin:  Negative for rash.  Allergic/Immunologic: Negative for susceptible to infections.  Neurological:  Positive for numbness and weakness.  Hematological:  Negative for bruising/bleeding tendency.  Psychiatric/Behavioral:  Positive for sleep disturbance.     PMFS History:  Patient Active Problem List   Diagnosis Date Noted   Chronic heel pain, left 06/24/2021   Left hemiparesis ([place]) 04/30/2021   Bilateral foot pain 04/30/2021   Bilateral hand swelling 04/30/2021   Sedimentation rate elevation 04/30/2021   Bilateral dry eyes 04/30/2021   Generalized pain 04/30/2021   Urge urinary incontinence 08/21/2020   Mitral valve sclerosis 07/01/2020   Non-smoker 06/29/2020  Sinus tachycardia 06/29/2020   Shortness of breath 06/29/2020   Chronic recurrent major depressive disorder ([place]) 05/27/2020   History of 2019 novel coronavirus disease (COVID-19) 03/30/2020   Anemia of chronic disease 03/30/2020   Deep vein thrombosis (DVT) of proximal vein of left lower extremity ([place]) 03/13/2020   GAD (generalized anxiety disorder)  03/13/2020   Obstructive sleep apnea (adult) (pediatric) 02/07/2020   History of small bowel obstruction 12/05/2019   Benign essential HTN 10/12/2019   Local reactions to insect stings 10/04/2019   Coughing/wheezing 10/04/2019   Adverse drug reaction 10/04/2019   Third degree laceration of perineum, type 3c 07/16/2019   History of seizures 06/12/2019   Abdominal bloating 04/18/2019   History of fusion of cervical spine 03/29/2019   Menopausal vasomotor syndrome 03/16/2019   History of total hysterectomy 03/02/2019   Radiculopathy, cervical region 06/29/2018   Neck pain 05/12/2017   IBS (irritable bowel syndrome) 04/14/2017   Overweight (BMI 25.0-29.9) 04/14/2017   Right shoulder pain 04/14/2017   Chronic rhinitis 03/29/2016   Diverticulosis 03/29/2016   History of pituitary tumor 03/29/2016   Hypothyroidism 03/29/2016   Vertigo 03/29/2016   Vestibular migraine 03/29/2016   Chronic headaches 03/29/2016    Past Medical History:  Diagnosis Date   Angio-edema    Anxiety    Brain tumor (benign) (HCC)    Chronic right shoulder pain    Depression    Diverticulitis    Diverticulosis    Eczema    GERD (gastroesophageal reflux disease)    Hypertension    IBS (irritable bowel syndrome)    Ileus, postoperative ([place]) 02/2019   Migraines    Mild intermittent asthma 10/04/2019   Partial bowel obstruction ([place]) 02/2019   PONV (postoperative nausea and vomiting)    "Scopolamine patch helps a lot" per patient   Pre-diabetes    Seizures ([place]) 1990   07/12/2019 - "due to brain tumor, which has since been removed and no seizures since 1999"   Thyroid disease    Urticaria    Vertigo     Family History  Problem Relation Age of Onset   Sudden death Father        MVA   Gout Brother    Sudden death Brother        COVID   Asthma Maternal Aunt    Asthma Maternal Uncle    Allergic rhinitis Neg Hx    Eczema Neg Hx    Urticaria Neg Hx    Past Surgical History:  Procedure Laterality  Date   ABDOMINAL HYSTERECTOMY     ABLATION Bilateral    ANTERIOR CERVICAL DECOMP/DISCECTOMY FUSION Left 07/16/2019   Procedure: ANTERIOR CERVICAL DECOMPRESSION FUSION CERVICAL SEVEN-THORACIC ONE. LEFT-SIDED APPROACH;  Surgeon: Kary Kos, MD;  Location: [facility];  Service: Neurosurgery;  Laterality: Left;  left-side approach   BELPHAROPTOSIS REPAIR Left    BRAIN SURGERY     CERVICAL SPINE SURGERY  2020   COLONOSCOPY WITH ESOPHAGOGASTRODUODENOSCOPY (EGD)     COSMETIC SURGERY     Facial surgery due to dog bite   ENDOMETRIAL ABLATION     LAPAROSCOPIC LYSIS OF ADHESIONS  03/09/2019   with release of small bowel obstruction   NASAL SINUS SURGERY     OVARIAN CYST REMOVAL Right    TYMPANOSTOMY TUBE PLACEMENT     Social History   Social History Narrative   Not on file   Immunization History  Administered Date(s) Administered   Influenza,inj,quad, With Preservative 11/29/2017   Tdap 06/14/2019  Objective: Vital Signs: BP 134/78 (BP Location: Left Arm, Patient Position: Sitting, Cuff Size: Normal)   Pulse 86   Resp 16   Ht '5\' 4"'$  (1.626 m)   Wt 198 lb (89.8 kg)   LMP 03/29/2017   BMI 33.99 kg/m    Physical Exam Constitutional:      Appearance: She is obese.  Musculoskeletal:     Right lower leg: Edema present.     Left lower leg: Edema present.  Skin:    Findings: No rash.  Neurological:     Mental Status: She is alert.  Psychiatric:        Mood and Affect: Mood normal.      Musculoskeletal Exam:  Elbows full ROM no tenderness or swelling Wrists full ROM no tenderness or swelling Fingers full ROM no palpable synovitis Knees full ROM no tenderness or swelling Tenderness to pressure on bottom of calcaneus at anterior edge, extending to bottom of foot arch    Investigation: No additional findings.  Imaging: No results found.  Recent Labs: Lab Results  Component Value Date   WBC 8.1 07/12/2019   HGB 13.3 07/12/2019   PLT 287 07/12/2019   NA 139 07/12/2019    K 3.1 (L) 07/12/2019   CL 102 07/12/2019   CO2 28 07/12/2019   GLUCOSE 108 (H) 07/12/2019   BUN 12 07/12/2019   CREATININE 0.76 07/12/2019   BILITOT 0.4 11/09/2017   ALKPHOS 45 11/09/2017   AST 22 11/09/2017   ALT 14 11/09/2017   PROT 7.4 11/09/2017   ALBUMIN 4.1 11/09/2017   CALCIUM 9.4 07/12/2019   GFRAA >60 07/12/2019    Speciality Comments: No specialty comments available.  Procedures:  No procedures performed Allergies: Estradiol and Morphine   Assessment / Plan:     Visit Diagnoses: Chronic heel pain, left Bilateral foot pain  Pain localizes to possible plantar fasciitis or other ankle tendinopathy problem. Also has chronic venous insufficiency contributing. I do not see any joint synovitis, no apparent inflammatory changes. She may benefit to see podiatry for evaluation and recommendation for management whether different compression, orthotic, therapy or otherwise.  History of 2019 novel coronavirus disease (COVID-19)  May have some increased inflammation related to COVID illness but negative for autoantibodies or serum markers of inflammation from our workup. I do not see clinical criteria for systemic CTD and no new medication start at this time.  Orders: No orders of the defined types were placed in this encounter.  No orders of the defined types were placed in this encounter.    Follow-Up Instructions: No follow-ups on file.   Collier Salina, MD  Note - This record has been created using [employer].  Chart creation errors have been sought, but may not always  have been located. Such creation errors do not reflect on  the standard of medical care.

## 2021-06-24 ENCOUNTER — Ambulatory Visit (INDEPENDENT_AMBULATORY_CARE_PROVIDER_SITE_OTHER): Payer: Medicaid Other | Admitting: Internal Medicine

## 2021-06-24 ENCOUNTER — Encounter: Payer: Self-pay | Admitting: Internal Medicine

## 2021-06-24 VITALS — BP 134/78 | HR 86 | Resp 16 | Ht 64.0 in | Wt 198.0 lb

## 2021-06-24 DIAGNOSIS — Z8616 Personal history of COVID-19: Secondary | ICD-10-CM | POA: Diagnosis not present

## 2021-06-24 DIAGNOSIS — M79672 Pain in left foot: Secondary | ICD-10-CM | POA: Diagnosis not present

## 2021-06-24 DIAGNOSIS — G8929 Other chronic pain: Secondary | ICD-10-CM

## 2021-06-24 DIAGNOSIS — M79671 Pain in right foot: Secondary | ICD-10-CM | POA: Diagnosis not present

## 2021-08-12 ENCOUNTER — Telehealth: Payer: Self-pay | Admitting: Internal Medicine

## 2021-08-12 DIAGNOSIS — I878 Other specified disorders of veins: Secondary | ICD-10-CM

## 2021-08-12 DIAGNOSIS — G8929 Other chronic pain: Secondary | ICD-10-CM

## 2021-08-12 NOTE — Telephone Encounter (Signed)
Patient states at her last appointment Dr. Benjamine Mola was going to refer her to podiatry but hasn't received a call from [place] or the office she was to be referred to. Patient requests somewhere in [place] if possible.

## 2021-08-12 NOTE — Telephone Encounter (Signed)
Okay to refer-she has chronic ankle pain with plantar fasciitis and chronic venous stasis.

## 2021-08-12 NOTE — Telephone Encounter (Signed)
Referral placed and left message to advise patient.

## 2021-08-13 ENCOUNTER — Ambulatory Visit (INDEPENDENT_AMBULATORY_CARE_PROVIDER_SITE_OTHER): Payer: Medicaid Other | Admitting: Podiatry

## 2021-08-13 ENCOUNTER — Ambulatory Visit (INDEPENDENT_AMBULATORY_CARE_PROVIDER_SITE_OTHER): Payer: Medicaid Other

## 2021-08-13 ENCOUNTER — Encounter: Payer: Self-pay | Admitting: Podiatry

## 2021-08-13 DIAGNOSIS — M25571 Pain in right ankle and joints of right foot: Secondary | ICD-10-CM

## 2021-08-13 DIAGNOSIS — M722 Plantar fascial fibromatosis: Secondary | ICD-10-CM | POA: Diagnosis not present

## 2021-08-13 DIAGNOSIS — M25572 Pain in left ankle and joints of left foot: Secondary | ICD-10-CM | POA: Diagnosis not present

## 2021-08-13 MED ORDER — TRIAMCINOLONE ACETONIDE 10 MG/ML IJ SUSP
10.0000 mg | Freq: Once | INTRAMUSCULAR | Status: AC
  Administered 2021-08-13: 10 mg

## 2021-08-13 NOTE — Patient Instructions (Signed)

## 2021-08-14 NOTE — Progress Notes (Signed)
Subjective:   Patient ID: Kristine Zimmerman, female   DOB: 51 y.o.   MRN: [id number]   HPI Patient presents stating she has had a lot of pain in both of her heels and states that they bother her and has had some ankle pain stating that there is some sharp aching pains at times and this has been going on on and off but has been worse recently.  Does not smoke likes to be active does not remember any other issue and does have relative flatfoot deformity   Review of Systems  All other systems reviewed and are negative.       Objective:  Physical Exam Vitals and nursing note reviewed.  Constitutional:      Appearance: She is well-developed.  Pulmonary:     Effort: Pulmonary effort is normal.  Musculoskeletal:        General: Normal range of motion.  Skin:    General: Skin is warm.  Neurological:     Mental Status: She is alert.     Neurovascular status intact muscle strength found to be adequate range of motion adequate.  Patient is noted to have exquisite discomfort in the plantar aspect of both heels with inflammation fluid around the medial band and is found to have good digital perfusion well oriented x3.  Patient has moderate depression of the arch with good range of motion of the first MPJ and no indications currently of equinus condition.  Good digital perfusion well oriented x3     Assessment:  Acute plantar fasciitis bilateral with inflammation along with flatfoot deformity contributing factor      Plan:  H&P x-rays reviewed sterile prep and injected the fascial bilateral at insertion calcaneus 3 mg Kenalog 5 mg Xylocaine discussed supportive therapy discussed physical therapy shoe gear modifications.  Patient will be seen back as symptoms indicate  X-rays indicate there is flatfoot deformity no indication of current spur or stress fracture

## 2021-08-18 ENCOUNTER — Other Ambulatory Visit: Payer: Self-pay | Admitting: Podiatry

## 2021-08-18 DIAGNOSIS — M722 Plantar fascial fibromatosis: Secondary | ICD-10-CM

## 2021-09-17 ENCOUNTER — Ambulatory Visit (INDEPENDENT_AMBULATORY_CARE_PROVIDER_SITE_OTHER): Payer: Medicaid Other | Admitting: Podiatry

## 2021-09-17 ENCOUNTER — Ambulatory Visit (INDEPENDENT_AMBULATORY_CARE_PROVIDER_SITE_OTHER): Payer: Medicaid Other

## 2021-09-17 ENCOUNTER — Encounter: Payer: Self-pay | Admitting: Podiatry

## 2021-09-17 DIAGNOSIS — M7751 Other enthesopathy of right foot: Secondary | ICD-10-CM

## 2021-09-17 DIAGNOSIS — M722 Plantar fascial fibromatosis: Secondary | ICD-10-CM | POA: Diagnosis not present

## 2021-09-17 DIAGNOSIS — M109 Gout, unspecified: Secondary | ICD-10-CM

## 2021-09-17 MED ORDER — TRIAMCINOLONE ACETONIDE 10 MG/ML IJ SUSP
10.0000 mg | Freq: Once | INTRAMUSCULAR | Status: AC
  Administered 2021-09-17: 10 mg

## 2021-09-17 NOTE — Progress Notes (Signed)
Subjective:   Patient ID: Kristine Zimmerman, female   DOB: 51 y.o.   MRN: [id number]   HPI Patient presents stating over the last few days she is developed a lot of redness and pain around the big toe joint right and she is not sure what caused with patient having history of brother having had gout in the past    ROS      Objective:  Physical Exam  Neurovascular status intact with patient having redness and fluid around the first MPJ right that is painful when pressed and making shoe gear difficult at this time.  Patient states that this is new and she is never experienced this before and also feels stiffness in the joints stating that her heels are feeling good at this time     Assessment:  Possibility for gout of the first MPJ right versus structural bunion with inflammatory capsulitis around the first MPJ right foot     Plan:  H&P reviewed conditions and today I went ahead and I did do a sterile prep of the area and went ahead and injected the first MPJ periarticular 3 mg Kenalog 5 mg Xylocaine gave her information's on gout with all explanations as to the cause of gout and foods that I want her to avoid.  Discussed gout and if it were to recur we will get blood work and may have to consider oral treatment  X-rays were negative for signs that there is a change in the bone structure or indications of a osteolytic condition

## 2021-09-17 NOTE — Patient Instructions (Signed)
Gout  Gout is painful swelling of your joints. Gout is a type of arthritis. It is caused by having too much uric acid in your body. Uric acid is a chemical that is made when your body breaks down substances called purines. If your body has too much uric acid, sharp crystals can form and build up in your joints. This causes pain and swelling. Gout attacks can happen quickly and be very painful (acute gout). Over time, the attacks can affect more joints and happen more often (chronic gout). What are the causes? Gout is caused by too much uric acid in your blood. This can happen because: Your kidneys do not remove enough uric acid from your blood. Your body makes too much uric acid. You eat too many foods that are high in purines. These foods include organ meats, some seafood, and beer. Trauma or stress can bring on an attack. What increases the risk? Having a family history of gout. Being female and middle-aged. Being female and having gone through menopause. Having an organ transplant. Taking certain medicines. Having certain conditions, such as: Being very overweight (obese). Lead poisoning. Kidney disease. A skin condition called psoriasis. Other risks include: Losing weight too quickly. Not having enough water in the body (being dehydrated). Drinking alcohol, especially beer. Drinking beverages that are sweetened with a type of sugar called fructose. What are the signs or symptoms? An attack of acute gout often starts at night and usually happens in just one joint. The most common place is the big toe. Other joints that may be affected include joints of the feet, ankle, knee, fingers, wrist, or elbow. Symptoms may include: Very bad pain. Warmth. Swelling. Stiffness. Tenderness. The affected joint may be very painful to touch. Shiny, red, or purple skin. Chills and fever. Chronic gout may cause symptoms more often. More joints may be involved. You may also have white or yellow lumps  (tophi) on your hands or feet or in other areas near your joints. How is this treated? Treatment for an acute attack may include medicines for pain and swelling, such as: NSAIDs, such as ibuprofen. Steroids taken by mouth or injected into a joint. Colchicine. This can be given by mouth or through an IV tube. Treatment to prevent future attacks may include: Taking small doses of NSAIDs or colchicine daily. Using a medicine that reduces uric acid levels in your blood, such as allopurinol. Making changes to your diet. You may need to see a food expert (dietitian) about what to eat and drink to prevent gout. Follow these instructions at home: During a gout attack  If told, put ice on the painful area. To do this: Put ice in a plastic bag. Place a towel between your skin and the bag. Leave the ice on for 20 minutes, 2-3 times a day. Take off the ice if your skin turns bright red. This is very important. If you cannot feel pain, heat, or cold, you have a greater risk of damage to the area. Raise the painful joint above the level of your heart as often as you can. Rest the joint as much as possible. If the joint is in your leg, you may be given crutches. Follow instructions from your doctor about what you cannot eat or drink. Avoiding future gout attacks Eat a low-purine diet. Avoid foods and drinks such as: Liver. Kidney. Anchovies. Asparagus. Herring. Mushrooms. Mussels. Beer. Stay at a healthy weight. If you want to lose weight, talk with your doctor. Do not   lose weight too fast. Start or continue an exercise plan as told by your doctor. Eating and drinking Avoid drinks sweetened by fructose. Drink enough fluids to keep your pee (urine) pale yellow. If you drink alcohol: Limit how much you have to: 0-1 drink a day for women who are not pregnant. 0-2 drinks a day for men. Know how much alcohol is in a drink. In the [place] one drink equals one 12 oz bottle of beer (355 mL), one 5 oz  glass of wine (148 mL), or one 1 oz glass of hard liquor (44 mL). General instructions Take over-the-counter and prescription medicines only as told by your doctor. Ask your doctor if you should avoid driving or using machines while you are taking your medicine. Return to your normal activities when your doctor says that it is safe. Keep all follow-up visits. Where to find more information [employer]: www.niams.nih.gov Contact a doctor if: You have another gout attack. You still have symptoms of a gout attack after 10 days of treatment. You have problems (side effects) because of your medicines. You have chills or a fever. You have burning pain when you pee (urinate). You have pain in your lower back or belly. Get help right away if: You have very bad pain. Your pain cannot be controlled. You cannot pee. Summary Gout is painful swelling of the joints. The most common site of pain is the big toe, but it can affect other joints. Medicines and avoiding some foods can help to prevent and treat gout attacks. This information is not intended to replace advice given to you by your health care provider. Make sure you discuss any questions you have with your health care provider. Document Revised: 11/12/2020 Document Reviewed: 11/12/2020 [employer] Patient Education  2023 [employer]  

## 2021-10-28 ENCOUNTER — Ambulatory Visit: Payer: Medicaid Other | Admitting: Orthopaedic Surgery

## 2021-10-29 ENCOUNTER — Ambulatory Visit: Payer: Medicaid Other | Admitting: Podiatry

## 2021-11-05 ENCOUNTER — Encounter: Payer: Self-pay | Admitting: Podiatry

## 2021-11-05 ENCOUNTER — Ambulatory Visit (INDEPENDENT_AMBULATORY_CARE_PROVIDER_SITE_OTHER): Payer: Medicaid Other | Admitting: Podiatry

## 2021-11-05 ENCOUNTER — Ambulatory Visit (INDEPENDENT_AMBULATORY_CARE_PROVIDER_SITE_OTHER): Payer: Medicaid Other

## 2021-11-05 DIAGNOSIS — M7672 Peroneal tendinitis, left leg: Secondary | ICD-10-CM | POA: Diagnosis not present

## 2021-11-05 DIAGNOSIS — M25572 Pain in left ankle and joints of left foot: Secondary | ICD-10-CM

## 2021-11-05 DIAGNOSIS — M25571 Pain in right ankle and joints of right foot: Secondary | ICD-10-CM

## 2021-11-05 MED ORDER — TRIAMCINOLONE ACETONIDE 10 MG/ML IJ SUSP
10.0000 mg | Freq: Once | INTRAMUSCULAR | Status: AC
  Administered 2021-11-05: 10 mg

## 2021-11-06 ENCOUNTER — Other Ambulatory Visit: Payer: Self-pay | Admitting: Podiatry

## 2021-11-06 DIAGNOSIS — M7672 Peroneal tendinitis, left leg: Secondary | ICD-10-CM

## 2021-11-06 NOTE — Progress Notes (Signed)
Subjective:   Patient ID: Kristine Zimmerman, female   DOB: 51 y.o.   MRN: [id number]   HPI Patient states that her heels been doing pretty good but now she has pain on the outside of her left foot does not remember injury    ROS      Objective:  Physical Exam  Neurovascular status found to be intact with patient's heel improved and arch but a lot of pain in the outside of the left foot around the base of the fifth metatarsal     Assessment:  Peroneal tendinitis insertional left with inflammation probably compensatory     Plan:  H&P explained condition discussed compensatory discomfort and inflammation of the tendon insertion.  I did explain injection and risk and patient wants to have this done understanding risk of rupture.  I did sterile prep injected the sheath 3 mg dexamethasone Kenalog 5 mg Xylocaine advised on ice therapy support and reappoint to recheck  X-rays were negative of fracture in the base of the fifth metatarsal left appears to be soft tissue
